# Patient Record
Sex: Female | Born: 1940 | Race: Black or African American | Hispanic: No | Smoking: Never smoker
Health system: Southern US, Community
[De-identification: ages and names within clinical notes are randomized; demographics above are authoritative.]

## PROBLEM LIST (undated history)

## (undated) DIAGNOSIS — Z5189 Encounter for other specified aftercare: Secondary | ICD-10-CM

## (undated) DIAGNOSIS — F419 Anxiety disorder, unspecified: Secondary | ICD-10-CM

## (undated) DIAGNOSIS — Z8719 Personal history of other diseases of the digestive system: Secondary | ICD-10-CM

## (undated) DIAGNOSIS — R319 Hematuria, unspecified: Secondary | ICD-10-CM

## (undated) DIAGNOSIS — E785 Hyperlipidemia, unspecified: Secondary | ICD-10-CM

## (undated) DIAGNOSIS — F32A Depression, unspecified: Secondary | ICD-10-CM

## (undated) DIAGNOSIS — R1310 Dysphagia, unspecified: Secondary | ICD-10-CM

## (undated) DIAGNOSIS — K5790 Diverticulosis of intestine, part unspecified, without perforation or abscess without bleeding: Secondary | ICD-10-CM

## (undated) DIAGNOSIS — IMO0001 Reserved for inherently not codable concepts without codable children: Secondary | ICD-10-CM

## (undated) DIAGNOSIS — I10 Essential (primary) hypertension: Secondary | ICD-10-CM

## (undated) DIAGNOSIS — H269 Unspecified cataract: Secondary | ICD-10-CM

## (undated) DIAGNOSIS — N289 Disorder of kidney and ureter, unspecified: Secondary | ICD-10-CM

## (undated) DIAGNOSIS — M1990 Unspecified osteoarthritis, unspecified site: Secondary | ICD-10-CM

## (undated) DIAGNOSIS — J189 Pneumonia, unspecified organism: Secondary | ICD-10-CM

## (undated) DIAGNOSIS — R51 Headache: Secondary | ICD-10-CM

## (undated) DIAGNOSIS — E789 Disorder of lipoprotein metabolism, unspecified: Secondary | ICD-10-CM

## (undated) DIAGNOSIS — E039 Hypothyroidism, unspecified: Secondary | ICD-10-CM

## (undated) DIAGNOSIS — F329 Major depressive disorder, single episode, unspecified: Secondary | ICD-10-CM

## (undated) DIAGNOSIS — D649 Anemia, unspecified: Secondary | ICD-10-CM

## (undated) DIAGNOSIS — K219 Gastro-esophageal reflux disease without esophagitis: Secondary | ICD-10-CM

## (undated) DIAGNOSIS — I209 Angina pectoris, unspecified: Secondary | ICD-10-CM

## (undated) DIAGNOSIS — T7840XA Allergy, unspecified, initial encounter: Secondary | ICD-10-CM

## (undated) HISTORY — DX: Unspecified cataract: H26.9

## (undated) HISTORY — PX: ABDOMINAL HYSTERECTOMY: SHX81

## (undated) HISTORY — PX: THYROIDECTOMY: SHX17

## (undated) HISTORY — PX: APPENDECTOMY: SHX54

## (undated) HISTORY — PX: KIDNEY SURGERY: SHX687

## (undated) HISTORY — DX: Diverticulosis of intestine, part unspecified, without perforation or abscess without bleeding: K57.90

## (undated) HISTORY — PX: CHOLECYSTECTOMY: SHX55

## (undated) HISTORY — DX: Dysphagia, unspecified: R13.10

## (undated) HISTORY — DX: Hyperlipidemia, unspecified: E78.5

## (undated) HISTORY — DX: Allergy, unspecified, initial encounter: T78.40XA

---

## 1996-09-16 ENCOUNTER — Encounter (INDEPENDENT_AMBULATORY_CARE_PROVIDER_SITE_OTHER): Payer: Self-pay | Admitting: *Deleted

## 1997-05-07 ENCOUNTER — Encounter: Payer: Self-pay | Admitting: Internal Medicine

## 1997-05-25 ENCOUNTER — Encounter: Payer: Self-pay | Admitting: Internal Medicine

## 1999-06-26 ENCOUNTER — Other Ambulatory Visit: Admission: RE | Admit: 1999-06-26 | Discharge: 1999-06-26 | Payer: Self-pay | Admitting: Family Medicine

## 1999-07-19 ENCOUNTER — Encounter: Payer: Self-pay | Admitting: Family Medicine

## 1999-07-19 ENCOUNTER — Encounter: Admission: RE | Admit: 1999-07-19 | Discharge: 1999-07-19 | Payer: Self-pay | Admitting: Family Medicine

## 2000-04-04 ENCOUNTER — Encounter: Payer: Self-pay | Admitting: Family Medicine

## 2000-04-04 ENCOUNTER — Encounter: Admission: RE | Admit: 2000-04-04 | Discharge: 2000-04-04 | Payer: Self-pay | Admitting: Family Medicine

## 2002-10-09 ENCOUNTER — Encounter: Admission: RE | Admit: 2002-10-09 | Discharge: 2002-10-09 | Payer: Self-pay

## 2004-11-27 ENCOUNTER — Encounter (INDEPENDENT_AMBULATORY_CARE_PROVIDER_SITE_OTHER): Payer: Self-pay | Admitting: *Deleted

## 2004-11-27 ENCOUNTER — Ambulatory Visit (HOSPITAL_COMMUNITY): Admission: RE | Admit: 2004-11-27 | Discharge: 2004-11-27 | Payer: Self-pay | Admitting: Urology

## 2005-01-19 ENCOUNTER — Ambulatory Visit (HOSPITAL_COMMUNITY): Admission: RE | Admit: 2005-01-19 | Discharge: 2005-01-19 | Payer: Self-pay | Admitting: Urology

## 2005-02-26 ENCOUNTER — Encounter (INDEPENDENT_AMBULATORY_CARE_PROVIDER_SITE_OTHER): Payer: Self-pay | Admitting: *Deleted

## 2005-02-26 ENCOUNTER — Inpatient Hospital Stay (HOSPITAL_COMMUNITY): Admission: RE | Admit: 2005-02-26 | Discharge: 2005-03-02 | Payer: Self-pay | Admitting: Urology

## 2005-02-26 ENCOUNTER — Encounter (INDEPENDENT_AMBULATORY_CARE_PROVIDER_SITE_OTHER): Payer: Self-pay | Admitting: Specialist

## 2005-03-01 ENCOUNTER — Encounter (INDEPENDENT_AMBULATORY_CARE_PROVIDER_SITE_OTHER): Payer: Self-pay | Admitting: *Deleted

## 2005-08-24 ENCOUNTER — Ambulatory Visit (HOSPITAL_COMMUNITY): Admission: RE | Admit: 2005-08-24 | Discharge: 2005-08-24 | Payer: Self-pay | Admitting: Urology

## 2005-08-24 ENCOUNTER — Encounter (INDEPENDENT_AMBULATORY_CARE_PROVIDER_SITE_OTHER): Payer: Self-pay | Admitting: *Deleted

## 2006-08-02 ENCOUNTER — Encounter: Admission: RE | Admit: 2006-08-02 | Discharge: 2006-08-02 | Payer: Self-pay | Admitting: Family Medicine

## 2006-11-20 ENCOUNTER — Encounter: Admission: RE | Admit: 2006-11-20 | Discharge: 2006-11-20 | Payer: Self-pay | Admitting: Family Medicine

## 2006-12-06 ENCOUNTER — Encounter: Admission: RE | Admit: 2006-12-06 | Discharge: 2006-12-06 | Payer: Self-pay | Admitting: Neurosurgery

## 2009-02-15 ENCOUNTER — Encounter (INDEPENDENT_AMBULATORY_CARE_PROVIDER_SITE_OTHER): Payer: Self-pay | Admitting: *Deleted

## 2009-03-16 ENCOUNTER — Ambulatory Visit: Payer: Self-pay | Admitting: Internal Medicine

## 2009-03-16 DIAGNOSIS — R1013 Epigastric pain: Secondary | ICD-10-CM

## 2009-03-16 DIAGNOSIS — R1310 Dysphagia, unspecified: Secondary | ICD-10-CM | POA: Insufficient documentation

## 2009-03-16 DIAGNOSIS — K5730 Diverticulosis of large intestine without perforation or abscess without bleeding: Secondary | ICD-10-CM | POA: Insufficient documentation

## 2009-03-16 DIAGNOSIS — K219 Gastro-esophageal reflux disease without esophagitis: Secondary | ICD-10-CM | POA: Insufficient documentation

## 2009-03-16 DIAGNOSIS — E119 Type 2 diabetes mellitus without complications: Secondary | ICD-10-CM

## 2009-04-07 ENCOUNTER — Ambulatory Visit: Payer: Self-pay | Admitting: Internal Medicine

## 2009-04-11 ENCOUNTER — Encounter (INDEPENDENT_AMBULATORY_CARE_PROVIDER_SITE_OTHER): Payer: Self-pay | Admitting: *Deleted

## 2009-04-11 ENCOUNTER — Telehealth (INDEPENDENT_AMBULATORY_CARE_PROVIDER_SITE_OTHER): Payer: Self-pay | Admitting: *Deleted

## 2009-04-11 ENCOUNTER — Encounter: Payer: Self-pay | Admitting: Internal Medicine

## 2009-05-13 ENCOUNTER — Ambulatory Visit: Payer: Self-pay | Admitting: Internal Medicine

## 2009-05-13 ENCOUNTER — Ambulatory Visit (HOSPITAL_COMMUNITY): Admission: RE | Admit: 2009-05-13 | Discharge: 2009-05-13 | Payer: Self-pay | Admitting: Internal Medicine

## 2010-01-29 ENCOUNTER — Encounter: Payer: Self-pay | Admitting: Family Medicine

## 2010-02-07 NOTE — Assessment & Plan Note (Signed)
Summary: DYSPHASIA / ABDOMINAL PAIN /    History of Present Illness Visit Type: Initial Consult Primary GI MD: Yancey Flemings MD Primary Provider: Burnell Blanks, MD Requesting Provider: Dr. Nathanial Rancher Chief Complaint: chest pain non-cardiac & dysphagia x  4 months History of Present Illness:   [name] with multiple medical problems including hypothyroidism, hypertension, diabetes mellitus, dyslipidemia, depression, GERD complicated by peptic stricture, osteoarthritis, renal cell carcinoma, and glaucoma. She said today regarding reflux symptoms, epigastric pain, and dysphagia. Also, screening colonoscopy. She is accompanied by her granddaughter. The patient reports been under a great deal of stress since 2009/09/18when her husband passed away. Since that time she describes problems with burning chest discomfort and epigastric pain. Also intermittent dysphagia, mostly to solids, but occasionally for liquids. Food can exacerbate symptoms. However, she may have some symptoms unrelated to meals. She has had decreased appetite and 20 pound weight loss over the past year. Review of outside records from Dr. Miguel Rota office shows that the patient was treated with ciprofloxacin and metronidazole for left lower quadrant pain about one month ago. The pain has resolved. Patient also tells me that she was on Nexium for about 4 weeks. This helped some of her upper GI symptoms, though not all of her symptoms. Lower GI complaints include occasional constipation for which she takes pomegranate juice (this helps). No bleeding. She describes her epigastric discomfort as burning. No radiation. No additional exacerbating or relieving factors. The duration of symptoms varies as does the time of day that symptoms can occur. Review of old records also finds that the patient has undergone prior upper endoscopy with esophageal dilation in 1997, 1998, 1999. This for a benign peptic stricture. Also,  1999 she underwent colonoscopy to evaluate Hemoccult-positive stool. The report states that she had mild diverticulosis only. Her multiple significant medical problems are stable. She takes oral agents for her diabetes.   GI Review of Systems    Reports abdominal pain, acid reflux, chest pain, dysphagia with liquids, dysphagia with solids, loss of appetite, and  weight loss.     Location of  Abdominal pain: epigastric area. Weight loss of 20 pounds pounds over one year.   Denies belching, bloating, heartburn, nausea, vomiting, vomiting blood, and  weight gain.      Reports diverticulosis.     Denies anal fissure, black tarry stools, change in bowel habit, constipation, diarrhea, fecal incontinence, heme positive stool, hemorrhoids, irritable bowel syndrome, jaundice, light color stool, liver problems, rectal bleeding, and  rectal pain. Preventive Screening-Counseling & Management  Alcohol-Tobacco     Smoking Status: never      Drug Use:  no.      Current Medications (verified): 1)  Epipen 0.3 Mg/0.54ml Devi (Epinephrine) .... Use As Directed As Needed 2)  Glipizide 5 Mg Tabs (Glipizide) .Marland Kitchen.. 1 Tablet By Mouth Once Daily 3)  Synthroid 88 Mcg Tabs (Levothyroxine Sodium) .Marland Kitchen.. 1 Tablet By Mouth Once Daily 4)  Toprol Xl 25 Mg Xr24h-Tab (Metoprolol Succinate) .Marland Kitchen.. 1 Tablet By Mouth Once Daily 5)  Metformin Hcl 500 Mg Tabs (Metformin Hcl) .... Take 2 Tablets By Mouth Once Daily 6)  Furosemide 20 Mg Tabs (Furosemide) .... Take 1 By Mouth Once Daily As Needed Edema 7)  Crestor 5 Mg Tabs (Rosuvastatin Calcium) .... Take 1 By Mouth Once Daily 8)  Benicar Hct 20-12.5 Mg Tabs (Olmesartan Medoxomil-Hctz) .... 2 Tablet By Mouth Once Daily 9)  Lexapro 10 Mg Tabs (Escitalopram Oxalate) .Marland Kitchen.. 1 Tablet By Mouth  Once Daily 10)  Enablex 7.5 Mg Xr24h-Tab (Darifenacin Hydrobromide) .Marland Kitchen.. 1-2  Tablets By Mouth Once Daily 11)  Nexium 40 Mg Cpdr (Esomeprazole Magnesium) .Marland Kitchen.. 1 Capsule By Mouth Once Daily 12)   Klor-Con M20 20 Meq Cr-Tabs (Potassium Chloride Crys Cr) .... Take 40 Mg By Mouth Once Daily As Needed Along With Furosemide 13)  Multivitamins  Tabs (Multiple Vitamin) .Marland Kitchen.. 1 Tablet By Mouth Once Daily  Allergies: 1)  ! * Insect Stings 2)  ! Codeine 3)  ! Aspirin 4)  ! Sulfa 5)  ! Novocain  Past History:  Past Medical History: Renal Cell Carcinoma Thrombocytopenia Depression Hypertension Hypothyroidism Osteoarthritis Esophageal Stricture Gallstones Stress Incontinence Diabetes Diverticulosis GERD Glaucoma  Past Surgical History: nephrectomy hysterectomy Appendectomy Cholecystectomy Thyroid Surgery  Family History: No FH of Colon Cancer: Family History of Colon Polyps:Mother Family History of Heart Disease:   Social History: Occupation:  Patient has never smoked.  Alcohol Use - no Daily Caffeine Use- once weekly Illicit Drug Use - no Smoking Status:  never Drug Use:  no  Review of Systems       The patient complains of blood in urine, cough, depression-new, night sweats, and urine leakage.  The patient denies allergy/sinus, anemia, anxiety-new, arthritis/joint pain, back pain, breast changes/lumps, change in vision, confusion, coughing up blood, fainting, fatigue, fever, headaches-new, hearing problems, heart murmur, heart rhythm changes, itching, menstrual pain, muscle pains/cramps, nosebleeds, pregnancy symptoms, shortness of breath, skin rash, sleeping problems, sore throat, swelling of feet/legs, swollen lymph glands, thirst - excessive , urination - excessive , urination changes/pain, vision changes, and voice change.    Vital Signs:  Patient profile:   70 year old female Height:      65 inches Weight:      189 pounds BMI:     31.56 Pulse rate:   68 / minute Pulse rhythm:   regular BP sitting:   130 / 72  (left arm) Cuff size:   regular  Vitals Entered By: June McMurray CMA Duncan Dull) (March 16, 2009 9:55 AM)  Physical Exam  General:  Well  developed, well nourished, no acute distress. Head:  Normocephalic and atraumatic. Eyes:  PERRLA, no icterus. Nose:  No deformity, discharge,  or lesions. Mouth:  No deformity or lesions. Neck:  Supple; no masses or thyromegaly.prior thyroidectomy scar well healed Lungs:  Clear throughout to auscultation. Heart:  Regular rate and rhythm; no murmurs, rubs,  or bruits. Abdomen:  Soft,obese, nontender and nondistended. No masses, hepatosplenomegaly or hernias noted. Normal bowel sounds. Multiple prior surgical incisions well-healed. Rectal:  deferred until colonoscopy Msk:  Symmetrical with no gross deformities. Normal posture. Pulses:  Normal pulses noted. Extremities:  No clubbing, cyanosis, edema or deformities noted. Neurologic:  Alert and  oriented x4;  grossly normal neurologically. Skin:  Intact without significant lesions or rashes. Psych:  Alert and cooperative. Normal mood and affect.   Impression & Recommendations:  Problem # 1:  DYSPHAGIA UNSPECIFIED (ICD-787.20) Intermittent dysphagia to solids greater than liquids. Known history of peptic stricture. Suspect recurrence of the same.  Plan: #1. Upper endoscopy with esophageal dilation. The nature of the procedure as well as the risks, benefits, and alternatives have been reviewed. She understood and agreed to proceed. The patient is HIGH RISK given her multiple medical problems. #2. Need to adjust diabetic medications preprocedure in order to avoid unwanted hypoglycemia. The patient has been instructed to hold her oral diabetic medications the morning of the procedure. We will monitor her blood sugar immediately  before and after her procedure.  Problem # 2:  GERD (ICD-530.81) chronic GERD. Some symptoms improved on PPI. Now off PPI therapy. Nexium too expensive.  Plan: #1. Reflux precautions #2. Prescribe omeprazole 40 mg daily. As well, a few samples of Nexium given until she can pick up her prescription  Problem # 3:   ABDOMINAL PAIN, EPIGASTRIC (ICD-789.06) Epigastric pain probably related to reflux.  Plan: #1. Daily PPI #2. At the time of endoscopy, assess her other possible causes for epigastric pain #3. If pain persists despite adequate treatment of GERD, consider CT scan, particularly given weight loss, to evaluate for other causes  Problem # 4:  SPECIAL SCREENING FOR MALIGNANT NEOPLASMS COLON (ICD-V76.51) last colonoscopy in 1999. Due for repeat screening. Minimal lower GI symptoms except for mild constipation managed with pomegranate juice and recent left lower quadrant discomfort, possibly diverticulitis.  Plan: #1. Colonoscopy. The nature of the procedure as well as the risks, benefits, and alternatives were reviewed. She understood and agreed to proceed #2. Movi prep prescribed. The patient instructed on its use #3. Perform the procedure on the same day as endoscopy for patient convenience and to minimize risk #4. The just diabetic medications as stated above  Problem # 5:  DM (ICD-250.00) let us diabetic medications in order to avoid unwanted hypoglycemia. This has been outlined in detail above  Problem # 6:  DIVERTICULOSIS-COLON (ICD-562.10) on prior colonoscopy. Question recent diverticulitis. Assess further at colonoscopy  Other Orders: Colon/Endo (Colon/Endo)  Patient Instructions: 1)  Colon/Endo [facility] 04/07/09 3:30 pm 2)  Movi prep instructions given to patient and Rx. sent to patient's pharmacy  3)  Colonoscopy and Flexible Sigmoidoscopy brochure given.  4)  Upper Endoscopy brochure given.  5)  Rx. for Omeprazole 40 mg 1 by mouth once daily #30 x 11 RFs. 6)  Patient is to hold oral diabetic meds morning of procedure. 7)  cc:  Burnell Blanks, MD 8)  The medication list was reviewed and reconciled.  All changed / newly prescribed medications were explained.  A complete medication list was provided to the patient / caregiver. 9)  Pt. instructions printed and given to patient. Milford Cage NCMA  March 16, 2009 10:42 AM Prescriptions: OMEPRAZOLE 40 MG CPDR (OMEPRAZOLE) 1 CAPSULE by mouth QD  #30 x 11   Entered by:   Milford Cage NCMA   Authorized by:   Hilarie Fredrickson MD   Signed by:   Milford Cage NCMA on 03/16/2009   Method used:   Print then Give to Patient   RxID:   [id number] MOVIPREP 100 GM  SOLR (PEG-KCL-NACL-NASULF-NA ASC-C) As per prep instructions.  #1 x 0   Entered by:   Milford Cage NCMA   Authorized by:   Hilarie Fredrickson MD   Signed by:   Milford Cage NCMA on 03/16/2009   Method used:   Print then Give to Patient   RxID:   [id number]

## 2010-02-07 NOTE — Procedures (Signed)
Summary: Endoscopy with Savary Dilatation/[facility]  Endoscopy with Savary Dilatation/[facility]   Imported By: Sherian Rein 03/17/2009 13:35:11  _____________________________________________________________________  External Attachment:    Type:   Image     Comment:   External Document

## 2010-02-07 NOTE — Procedures (Signed)
Summary: Upper Endoscopy & Esophageal Dilation/[facility]  Upper Endoscopy & Esophageal Dilation/[facility]   Imported By: Sherian Rein 03/17/2009 13:37:25  _____________________________________________________________________  External Attachment:    Type:   Image     Comment:   External Document

## 2010-02-07 NOTE — Letter (Signed)
Summary: EGD Instructions  [facility]  [address] [place], [place] [address]   Phone: [phone]  Fax: [phone]       [name]    04-04-1940    MRN: [id number]       Procedure Day /Date:Friday-May 6/11     Arrival Time: 7:30 am     Procedure Time:8:30 am     Location of Procedure:                    _  _ [facility] (4th Floor) _ X _ [facility] ( Outpatient Registration) _  _ [facility] (Short Stay on [address]   PREPARATION FOR ENDOSCOPY   On Friday5/6/11  THE DAY OF THE PROCEDURE:  1.   No solid foods, milk or milk products are allowed after midnight the night before your procedure.  2.   Do not drink anything colored red or purple.  Avoid juices with pulp.  No orange juice.  3.  You may drink clear liquids until_  _, which is 2 hours before your procedure.                                                                                                CLEAR LIQUIDS INCLUDE: Water Jello Ice Popsicles Tea (sugar ok, no milk/cream) Powdered fruit flavored drinks Coffee (sugar ok, no milk/cream) Gatorade Juice: apple, white grape, white cranberry  Lemonade Clear bullion, consomm, broth Carbonated beverages (any kind) Strained chicken noodle soup Hard Candy   MEDICATION INSTRUCTIONS-.Unless otherwise instructed, you should take regular prescription medications with a small sip of water as early as possible the morning of your procedure.  Diabetic patients -   Additional medication instructions:-Do not take oral diabetic pills the am of the procedure             OTHER INSTRUCTIONS  You will need a responsible adult at least 68 years of age to accompany you and drive you home.   This person must remain in the waiting room during your procedure.  Wear loose fitting clothing that is easily removed.  Leave jewelry and other valuables at home.  However, you may wish to bring a book to read or  an iPod/MP3 player to listen to music as you wait for your procedure to start.  Remove all body piercing jewelry and leave at home.  Total time from sign-in until discharge is approximately 2-3 hours.  You should go home directly after your procedure and rest.  You can resume normal activities the day after your procedure.  The day of your procedure you should not:   Drive   Make legal decisions   Operate machinery   Drink alcohol   Return to work  You will receive specific instructions about eating, activities and medications before you leave.    The above instructions have been reviewed and explained to me by   Elnita Maxwell via phone and mailed  04/12/09   I

## 2010-02-07 NOTE — Procedures (Signed)
Summary: Colonoscopy  Patient: [name] Note: All result statuses are Final unless otherwise noted.  Tests: (1) Colonoscopy (COL)   COL Colonoscopy           DONE     [facility]     [address]     [place], [place]  [address]           COLONOSCOPY PROCEDURE REPORT           PATIENT:  [name], [name]  MR#:  [id number]     BIRTHDATE:  07/24/1940, 68 yrs. old  GENDER:  female     ENDOSCOPIST:  Wilhemina Bonito. Eda Keys, MD     REF. BY:  Burnell Blanks, M.D.     PROCEDURE DATE:  04/07/2009     PROCEDURE:  Colonoscopy with snare polypectomy X 2           INDICATIONS:  Routine Risk Screening     MEDICATIONS:   Fentanyl 100 mcg IV, Versed 10 mg IV, Benadryl 25mg      IV           DESCRIPTION OF PROCEDURE:   After the risks benefits and     alternatives of the procedure were thoroughly explained, informed     consent was obtained.  Digital rectal exam was performed and     revealed no abnormalities.   The LB CF-H180AL [id number] endoscope     was introduced through the anus and advanced to the cecum, which     was identified by both the appendix and ileocecal valve, without     limitations.TIME TO CECUM = 8:12MIN The quality of the prep was     excellent, using MoviPrep.  The instrument was then slowly     withdrawn (TIME = 10:52 MIN) as the colon was fully examined.     <<PROCEDUREIMAGES>>           FINDINGS:  Difficult to advance scope secondary to adhesions /     diverticulosis. Two polyps were found. 4mm descending and 9mm     rectal. Polyps were snared without cautery. Retrieval was     successful.  Moderate diverticulosis was found throughout the     colon with sigmoid stenosis.   Retroflexed views in the rectum     revealed internal hemorrhoids.    The scope was then withdrawn     from the patient and the procedure completed.           COMPLICATIONS:  None     ENDOSCOPIC IMPRESSION:     1) Two polyps - removed     2) Moderate diverticulosis throughout the colon w/  sigmoid     stenosis     3) Internal hemorrhoids           RECOMMENDATIONS:     1) Follow up colonoscopy in 5 years     2) EGD today           ______________________________     Wilhemina Bonito. Eda Keys, MD           CC:  Burnell Blanks, MD; The Patient           n.     eSIGNED:   Wilhemina Bonito. Eda Keys at 04/07/2009 03:37 PM           Roosvelt Harps, [id number]  Note: An exclamation mark (!) indicates a result that was not dispersed into the flowsheet. Document Creation Date: 04/07/2009 3:37 PM _______________________________________________________________________  (1) Order  result status: Final Collection or observation date-time: 04/07/2009 15:28 Requested date-time:  Receipt date-time:  Reported date-time:  Referring Physician:   Ordering Physician: Fransico Setters [phone]) Specimen Source:  Source: Launa Grill Order Number: [id number] Lab site:   Appended Document: Colonoscopy recall 5 yrs/04-2014     Procedures Next Due Date:    Colonoscopy: 04/2014

## 2010-02-07 NOTE — Procedures (Signed)
Summary: Upper Endoscopy  Patient: [name] Note: All result statuses are Final unless otherwise noted.  Tests: (1) Upper Endoscopy (EGD)   EGD Upper Endoscopy       DONE     [facility]     [address]     [place], [place]  [address]           ENDOSCOPY PROCEDURE REPORT           PATIENT:  [name], [name]  MR#:  [id number]     BIRTHDATE:  Apr 18, 1940, 68 yrs. old  GENDER:  female           ENDOSCOPIST:  Wilhemina Bonito. Eda Keys, MD     Referred by:  Burnell Blanks, M.D.           PROCEDURE DATE:  04/07/2009     PROCEDURE:  EGD, diagnostic     ASA CLASS:  Class II     INDICATIONS:  dysphagia, abdominal pain           MEDICATIONS:   There was residual sedation effect present from     prior procedure., Versed 4 mg IV     TOPICAL ANESTHETIC:  Exactacain Spray           DESCRIPTION OF PROCEDURE:   After the risks benefits and     alternatives of the procedure were thoroughly explained, informed     consent was obtained.  The LB GIF-H180 [id number] endoscope was     introduced through the mouth and advanced to the second portion of     the duodenum, without limitations.  The instrument was slowly     withdrawn as the mucosa was fully examined.     <<PROCEDUREIMAGES>>           Several shelf-like  webs were present in the distal third of the     esophagus.  A stricture was found in the distal esophagus as     wellas reflux esophagitis.  Otherwise the examination was normal     to D2.    Retroflexed views revealed a small hiatal hernia.    The     scope was then withdrawn from the patient and the procedure     completed. No dilation due to anatomy           COMPLICATIONS:  None           ENDOSCOPIC IMPRESSION:     1) Webs of the esophagus     2) Stricture in the distal esophagus     3) Esophagitis     4) GERD           RECOMMENDATIONS:     1) continue PPI - Omeprazole daily     2) EGD/DILATION (savary) at HOSPITAL in 4-6 weeks        ______________________________     Wilhemina Bonito. Eda Keys, MD           CC:  Burnell Blanks, MD, The Patient           n.     eSIGNEDWilhemina Bonito. Eda Keys at 04/07/2009 03:50 PM           Roosvelt Harps, [id number]  Note: An exclamation mark (!) indicates a result that was not dispersed into the flowsheet. Document Creation Date: 04/07/2009 3:51 PM _______________________________________________________________________  (1) Order result status: Final Collection or observation date-time: 04/07/2009 15:42 Requested date-time:  Receipt date-time:  Reported date-time:  Referring Physician:  Ordering Physician: Fransico Setters [phone]) Specimen Source:  Source: Launa Grill Order Number: [id number] Lab site:

## 2010-02-07 NOTE — Letter (Signed)
Summary: Patient Notice- Polyp Results  [facility]  [address] [place], [place] [address]   Phone: [phone]  Fax: [phone]        April 11, 2009 MRN: [id number]    ALLEIGH MOLLICA [address] [place], [place]  [address]    Dear Ms. Pfost,  I am pleased to inform you that the colon polyp(s) removed during your recent colonoscopy was (were) found to be benign (no cancer detected) upon pathologic examination.  I recommend you have a repeat colonoscopy examination in 5 years to look for recurrent polyps, as having colon polyps increases your risk for having recurrent polyps or even colon cancer in the future.  Should you develop new or worsening symptoms of abdominal pain, bowel habit changes or bleeding from the rectum or bowels, please schedule an evaluation with either your primary care physician or with me.  Additional information/recommendations:  __ No further action with gastroenterology is needed at this time. Please      follow-up with your primary care physician for your other healthcare      needs.  Please call us if you are having persistent problems or have questions about your condition that have not been fully answered at this time.  Sincerely,  Hilarie Fredrickson MD  This letter has been electronically signed by your physician.  Appended Document: Patient Notice- Polyp Results letter mailed 4.7.11

## 2010-02-07 NOTE — Procedures (Signed)
Summary: Upper Endoscopy  Patient: Collyns Mcquigg Note: All result statuses are Final unless otherwise noted.  Tests: (1) Upper Endoscopy (EGD)   EGD Upper Endoscopy       DONE     [facility]     [address] [place], [place]  [address]           ENDOSCOPY PROCEDURE REPORT           PATIENT:  Marie, Borowski  MR#:  [id number]     BIRTHDATE:  October 29, 1940, 68 yrs. old  GENDER:  female           ENDOSCOPIST:  Wilhemina Bonito. Eda Keys, MD     Referred by:  .Direct           PROCEDURE DATE:  05/13/2009     PROCEDURE:  EGD with dilatation over guidewire - 18mm Savary     FLUOROSCOPY ASSITANCE FOR     ESOPHAGEAL STRICTURE DILATION     ASA CLASS:  Class II     INDICATIONS:  dilation of esophageal stricture, dysphagia           MEDICATIONS:   Fentanyl 100 mcg, Versed 7.5 mg IV     TOPICAL ANESTHETIC:  Cetacaine Spray           DESCRIPTION OF PROCEDURE:   After the risks benefits and     alternatives of the procedure were thoroughly explained, informed     consent was obtained.  The Pentax Gastroscope [id number] endoscope     was introduced through the mouth and advanced to the second     portion of the duodenum, without limitations.  The instrument was     slowly withdrawn as the mucosa was fully examined.     <<PROCEDUREIMAGES>>           A 15mm stricture was found in the distal esophagus.  Otherwise the     examination was unremarkable to D2.    Retroflexed views revealed     a small hiatal hernia.           THERAPY: SAVARY GUIDEWIRE PLACED INTO GASTRIC ANTRUM UNDER     ENDOSCOPIC AND FLUOROSCOPIC CONTROL. SCOPE REMOVED. DILATOR     PASSED OVER WIRE. FLUO USED THROUGHOUT. NO RESISTANCE OR HEME.     TOLERATED WELL           COMPLICATIONS:  None           ENDOSCOPIC IMPRESSION:     1) Stricture in the distal esophagus - S/P DILATION     2) Otherwise normal examination     3) A hiatal hernia     4) Gerd     RECOMMENDATIONS:     1) Clear liquids until 11:30  AM, then soft foods rest of day.     Resume prior diet tomorrow.     2) continue current medications     3) follow-up: GI lab PRN           ______________________________     Wilhemina Bonito. Eda Keys, MD           CC:  Burnell Blanks, MD, The Patient           n.     eSIGNEDWilhemina Bonito. Eda Keys at 05/13/2009 09:29 AM           Roosvelt Harps, [id number]  Note: An exclamation mark (!) indicates a result that was not dispersed  into the flowsheet. Document Creation Date: 05/13/2009 9:30 AM _______________________________________________________________________  (1) Order result status: Final Collection or observation date-time: 05/13/2009 09:22 Requested date-time:  Receipt date-time:  Reported date-time:  Referring Physician:   Ordering Physician: Fransico Setters [phone]) Specimen Source:  Source: Launa Grill Order Number: [id number] Lab site:

## 2010-02-07 NOTE — Progress Notes (Signed)
  Phone Note Outgoing Call   Summary of Call: message left for pt. to call back to schedule dil at [facility]. Initial call taken by: Teryl Lucy RN,  April 11, 2009 9:17 AM  Follow-up for Phone Call        pt. scheduled for sav/dil with fluro at [facility] on 05/13/09 at 8:30 am.Given directions over the phone to do clear liquids from midnight  until 4:30 am and then to be npo 4 hrs. prior.Told to hold diabetic meds that am  until after procedure.Instructions also mailed. Follow-up by: Teryl Lucy RN,  April 11, 2009 1:59 PM

## 2010-02-07 NOTE — Letter (Signed)
Summary: [facility] Instructions  [facility]  [address] [place], [place] [address]   Phone: [phone]  Fax: [phone]       [name]    09-05-1940    MRN: [id number]        Procedure Day /Date:THURSDAY, 04/07/09     Arrival Time:2:30 PM     Procedure Time:3:30 PM     Location of Procedure:                    X  [facility] (4th Floor)                        PREPARATION FOR COLONOSCOPY WITH MOVIPREP/ENDO   Starting 5 days prior to your procedure 04/02/09 do not eat nuts, seeds, popcorn, corn, beans, peas,  salads, or any raw vegetables.  Do not take any fiber supplements (e.g. Metamucil, Citrucel, and Benefiber).  THE DAY BEFORE YOUR PROCEDURE         DATE: 04/06/09  ONG:[id number]  1.  Drink clear liquids the entire day-NO SOLID FOOD  2.  Do not drink anything colored red or purple.  Avoid juices with pulp.  No orange juice.  3.  Drink at least 64 oz. (8 glasses) of fluid/clear liquids during the day to prevent dehydration and help the prep work efficiently.  CLEAR LIQUIDS INCLUDE: Water Jello Ice Popsicles Tea (sugar ok, no milk/cream) Powdered fruit flavored drinks Coffee (sugar ok, no milk/cream) Gatorade Juice: apple, white grape, white cranberry  Lemonade Clear bullion, consomm, broth Carbonated beverages (any kind) Strained chicken noodle soup Hard Candy                             4.  In the morning, mix first dose of MoviPrep solution:    Empty 1 Pouch A and 1 Pouch B into the disposable container    Add lukewarm drinking water to the top line of the container. Mix to dissolve    Refrigerate (mixed solution should be used within 24 hrs)  5.  Begin drinking the prep at 5:00 p.m. The MoviPrep container is divided by 4 marks.   Every 15 minutes drink the solution down to the next mark (approximately 8 oz) until the full liter is complete.   6.  Follow completed prep with 16 oz of clear liquid of your choice  (Nothing red or purple).  Continue to drink clear liquids until bedtime.  7.  Before going to bed, mix second dose of MoviPrep solution:    Empty 1 Pouch A and 1 Pouch B into the disposable container    Add lukewarm drinking water to the top line of the container. Mix to dissolve    Refrigerate  THE DAY OF YOUR PROCEDURE      DATE: 04/07/09 DAY: THURSDAY  Beginning at 10:30 a.m. (5 hours before procedure):         1. Every 15 minutes, drink the solution down to the next mark (approx 8 oz) until the full liter is complete.  2. Follow completed prep with 16 oz. of clear liquid of your choice.    3. You may drink clear liquids until 1:30 PM (2 HOURS BEFORE PROCEDURE).   MEDICATION INSTRUCTIONS  Unless otherwise instructed, you should take regular prescription medications with a small sip of water   as early as possible the morning of your procedure.  Diabetic patients -  see separate instructions.    Additional medication instructions: _         OTHER INSTRUCTIONS  You will need a responsible adult at least 68 years of age to accompany you and drive you home.   This person must remain in the waiting room during your procedure.  Wear loose fitting clothing that is easily removed.  Leave jewelry and other valuables at home.  However, you may wish to bring a book to read or  an iPod/MP3 player to listen to music as you wait for your procedure to start.  Remove all body piercing jewelry and leave at home.  Total time from sign-in until discharge is approximately 2-3 hours.  You should go home directly after your procedure and rest.  You can resume normal activities the  day after your procedure.  The day of your procedure you should not:   Drive   Make legal decisions   Operate machinery   Drink alcohol   Return to work  You will receive specific instructions about eating, activities and medications before you leave.    The above instructions have been  reviewed and explained to me by   _______________________    I fully understand and can verbalize these instructions _____________________________ Date _________

## 2010-02-07 NOTE — Procedures (Signed)
Summary: Colonoscopy/[facility]  Colonoscopy/[facility]   Imported By: Sherian Rein 03/17/2009 13:39:27  _____________________________________________________________________  External Attachment:    Type:   Image     Comment:   External Document

## 2010-02-07 NOTE — Letter (Signed)
Summary: Diabetic Instructions  [facility]  [address] [place], [place] [address]   Phone: [phone]  Fax: [phone]    CABRINA SHIROMA 09-03-1940 MRN: [id number]   X  ORAL DIABETIC MEDICATION INSTRUCTIONS  The day before your procedure:   Take your diabetic pill as you do normally  The day of your procedure:   Do not take your diabetic pill    We will check your blood sugar levels during the admission process and again in Recovery before discharging you home  ________________________________________________________________________  _  _   INSULIN (LONG ACTING) MEDICATION INSTRUCTIONS (Lantus, NPH, 70/30, Humulin, Novolin-N)   The day before your procedure:   Take  your regular evening dose    The day of your procedure:   Do not take your morning dose    _  _   INSULIN (SHORT ACTING) MEDICATION INSTRUCTIONS (Regular, Humulog, Novolog)   The day before your procedure:   Do not take your evening dose   The day of your procedure:   Do not take your morning dose   _  _   INSULIN PUMP MEDICATION INSTRUCTIONS  We will contact the physician managing your diabetic care for written dosage instructions for the day before your procedure and the day of your procedure.  Once we have received the instructions, we will contact you.

## 2010-02-07 NOTE — Letter (Signed)
Summary: New Patient letter  [facility]  [address] [place], [place] [address]   Phone: [phone]  Fax: [phone]       02/15/2009 MRN: [id number]  [name] [address] [place], [place]  [address]  Dear [name],  Welcome to the Gastroenterology Division at [facility].    You are scheduled to see Dr.  Marina Goodell on 03-16-09 at 10AM on the 3rd floor at [facility], [address]  We ask that you try to arrive at our office 15 minutes prior to your appointment time to allow for check-in.  We would like you to complete the enclosed self-administered evaluation form prior to your visit and bring it with you on the day of your appointment.  We will review it with you.  Also, please bring a complete list of all your medications or, if you prefer, bring the medication bottles and we will list them.  Please bring your insurance card so that we may make a copy of it.  If your insurance requires a referral to see a specialist, please bring your referral form from your primary care physician.  Co-payments are due at the time of your visit and may be paid by cash, check or credit card.     Your office visit will consist of a consult with your physician (includes a physical exam), any laboratory testing he/she may order, scheduling of any necessary diagnostic testing (e.g. x-ray, ultrasound, CT-scan), and scheduling of a procedure (e.g. Endoscopy, Colonoscopy) if required.  Please allow enough time on your schedule to allow for any/all of these possibilities.    If you cannot keep your appointment, please call [phone] to cancel or reschedule prior to your appointment date.  This allows [place] the opportunity to schedule an appointment for another patient in need of care.  If you do not cancel or reschedule by 5 p.m. the business day prior to your appointment date, you will be charged a $50.00 late cancellation/no-show fee.    Thank you for choosing [facility] for your medical needs.  We appreciate the opportunity to care for you.  Please visit [place] at our website  to learn more about our practice.                     Sincerely,                                                             The Gastroenterology Division

## 2010-04-02 LAB — GLUCOSE, CAPILLARY
Glucose-Capillary: 113 mg/dL — ABNORMAL HIGH (ref 70–99)
Glucose-Capillary: 66 mg/dL — ABNORMAL LOW (ref 70–99)
Glucose-Capillary: 75 mg/dL (ref 70–99)
Glucose-Capillary: 81 mg/dL (ref 70–99)
Glucose-Capillary: 85 mg/dL (ref 70–99)

## 2010-05-26 NOTE — H&P (Signed)
NAMEMARISUE, [name]              ACCOUNT NO.:  [id number]   MEDICAL RECORD NO.:  [id number]          PATIENT TYPE:  INP   LOCATION:  0002                         FACILITY[facility]  [facility]   PHYSICIAN:  Heloise Purpura, MD      DATE OF BIRTH:  July 16, 1940   DATE OF ADMISSION:  02/26/2005  DATE OF DISCHARGE:                                HISTORY & PHYSICAL   CHIEF COMPLAINT:  1.  Microscopic hematuria.  2.  Left renal mass.   HISTORY:  [name] is a 70 year old female who began having abdominal pain  in her left lower quadrant. She subsequently underwent a CT scan for this  problem and incidentally was found to have a 2.7 x 2.5 cm renal mass in the  anterior aspect of the interpolar region of the left kidney concerning for a  possible renal malignancy. She subsequently underwent an MRI to further  characterize this and it was felt to be an enhancing mass worrisome for  possible malignancy. In addition, she was noted to have microscopic  hematuria. After discussing options for management, the patient elected to  undergo a laparoscopic partial nephrectomy for her renal mass. I also  explained to her that she would need further evaluation of her lower urinary  tract to completely evaluate her for her microscopic hematuria. We therefore  discussed performing cystoscopy and bilateral retrograde pyelography at the  time of her partial nephrectomy. She did undergo a metastatic evaluation as  well which was negative for metastatic disease.   PAST MEDICAL HISTORY:  1.  Diabetes mellitus.  2.  Hypertension.  3.  Hypothyroidism.  4.  Gastroesophageal reflux disease.  5.  Hypercholesterolemia.  6.  Depression.   PAST SURGICAL HISTORY:  1.  Thyroidectomy.  2.  Open cholecystectomy.  3.  Open appendectomy.  4.  Transabdominal hysterectomy for benign disease.  5.  Esophageal dilation.   MEDICATIONS:  1.  Lexapro.  2.  Enalapril.  3.  Glucophage.  4.  Glucotrol.  5.  Synthroid.  6.   Toprol-XL.  7.  Nexium.  8.  Crestor.  9.  Naproxen.   ALLERGIES:  1.  SULFA.  2.  IV CONTRAST DYE.  3.  CODEINE causing agitation.   FAMILY HISTORY:  There is no history of renal cell carcinoma or GU  malignancy in the family. There is no history of kidney failure.   SOCIAL HISTORY:  The patient is married. She is currently retired. She  denies tobacco or alcohol use.   PHYSICAL EXAMINATION:  CONSTITUTIONAL:  The patient is a well-nourished and  well-developed age-appropriate female.  LYMPH NODE SURVEY:  Negative.  CARDIOVASCULAR:  Regular rate and rhythm without obvious murmurs.  LUNGS:  Clear bilaterally.  ABDOMEN:  Soft, nontender, nondistended, without abdominal masses or bruits.  BACK:  No CVA tenderness.  EXTREMITIES:  No edema.  NEUROLOGIC:  Grossly intact.   IMPRESSION:  1.  Left renal mass worrisome for renal malignancy.  2.  Microscopic hematuria.   PLAN:  The patient will undergo cystoscopy, bilateral retrograde  pyelography, and a left laparoscopic partial nephrectomy, and then  be  admitted to the hospital for routine postoperative care.           ______________________________  Heloise Purpura, MD  Electronically Signed     LB/MEDQ  D:  02/26/2005  T:  02/26/2005  Job:  [id number]

## 2010-05-26 NOTE — Op Note (Signed)
[name], [name]              ACCOUNT NO.:  [id number]   MEDICAL RECORD NO.:  [id number]          PATIENT TYPE:  INP   LOCATION:  1420                         FACILITY:  [facility]   PHYSICIAN:  Heloise Purpura, MD      DATE OF BIRTH:  June 28, 1940   DATE OF PROCEDURE:  02/26/2005  DATE OF DISCHARGE:                                 OPERATIVE REPORT   PREOPERATIVE DIAGNOSIS:  1.  Microscopic hematuria.  2.  Left renal mass.   POSTOPERATIVE DIAGNOSES:  1.  Microscopic hematuria.  2.  Left renal mass.   PROCEDURE:  1.  Cystoscopy.  2.  Bilateral retrograde pyelography.  3.  Left laparoscopic partial nephrectomy.   SURGEON:  Dr. Heloise Purpura   ASSISTANT:  Dr. Marcine Matar   ANESTHESIA:  General.   COMPLICATIONS:  None.   ESTIMATED BLOOD LOSS:  500 mL.   INTRAVENOUS FLUIDS:  2900 mL of lactated Ringer's and 500 mL of Hespan.   INTRAOPERATIVE FINDINGS:  Frozen section of tumor base demonstrated benign  renal parenchyma.   DRAINS:  1.  A 15-French Blake.  2.  A 16-French Foley catheter.   SPECIMEN:  Left renal mass.   INDICATIONS:  [name] is a 70 year old female, who was recently found to  have microscopic hematuria as well as an incidentally detected left renal  mass.  After undergoing a negative metastatic evaluation and reviewing both  her CT scan and MRI which demonstrated an enhancing left renal mass  worrisome for malignancy, it was decided to proceed with the above  procedures.  Potential risks and benefits of these procedures were discussed  with the patient, and she consented.   DESCRIPTION OF PROCEDURE:  Radiologic findings:  Bilateral retrograde  pyelography was performed.  This demonstrated normal caliber ureters and no  evidence of filling defects within the renal pelvis or ureters bilaterally.   The patient was taken to the operating room, and a general anesthetic was  administered.  She was given preoperative antibiotics, placed in the dorsal  lithotomy position, and prepped and draped in the usual sterile fashion.  Next, a preoperative time-out was performed.  Cystourethroscopy was then  performed.  The patient was noted to have an extremely small urethral  meatus.  Therefore, female sounds were used to perform a urethral dilation  allowing the 22 French cystoscope sheath to be passed into the bladder.  This demonstrated a normal urethra.  A systematic survey of the bladder was  then performed and demonstrated no evidence of bladder tumors, stones, or  other mucosal pathology.  The ureteral orifices were noted to be in their  normal anatomic position bilaterally and effluxing clear urine.  A 5 French  ureteral catheter was then used to perform retrograde pyelography.  The  right side was first injected with hypaque contrast.  This demonstrated no  evidence of filling defects or other abnormalities.  An identical procedure  was then performed on the contralateral side and also demonstrated no  evidence of any filling defects or abnormalities in the left renal  collecting system.  The cystoscope was then removed  and replaced with a 16-  [place] Foley catheter.  The patient was then repositioned in the left  modified flank position.  Care was taken to ensure proper padding of all  potential pressure points.  She was then reprepped and draped in the usual  sterile fashion.  A site was selected just superior to the umbilicus in the  midline for placement of a camera port.  This was placed using a standard  Hasson technique.  This allowed entry into the peritoneal cavity under  direct vision.  Zero Vicryl fascial holding stitches were then placed in the  rectus fascia.  The Hasson port was then placed and secured with holding  sutures.  A pneumoperitoneum was established.  The 30-degree lens was then  used to inspect the abdomen, and there were noted to be adhesions in the  right upper quadrant as well as in the left lower quadrant,  most likely  related to the patient's prior cholecystectomy and transabdominal  hysterectomy.  However, the left upper quadrant appeared free of adhesions.  The remaining ports were then placed.  A 12 mm port was placed in the left  lower quadrant just lateral to the rectus muscle above the previously  mentioned adhesions.  A 5 mm port was placed midway between the xiphoid  process and the umbilicus just to the left of the midline.  An additional 5  mm port was placed in the far left lateral abdominal wall.  All ports were  placed under direct vision without difficulty.  With the aid of the harmonic  scalpel, the white line of Toldt was incised along the length of the  descending colon allowing the colon to be mobilized medially.  In addition,  some of the attachments between the colon and spleen were taken down to  allow the spleen to be retracted cephalad.  The kidney was dissected within  Gerota's fascia with the space between the colonic mesentery and the  anterior layer of Gerota's fascia developed.  Inferiorly, the ureter was  identified, and the space between the ureter and the psoas was able to be  bluntly developed allowing the ureter to be lifted anteriorly.  Dissection  then continued superiorly, and the renal vein was encountered.  The renal  vein was then cleared off of overlying tissue.  The renal artery was  identified just posterior to the renal vein.  On imaging, it did appear that  the patient had a single renal artery.  There was noted to be a small vein  or lymphatic that was just overlying the renal artery, and this was divided  between 5 mm Ligaclips.  The renal artery was then able to be isolated with  right angle dissection and prepared for vascular clamping.  Attention then  returned to the renal parenchyma.  The overlying perinephric fat was removed  from around the area of the tumor which was easily identifiable on the anterior aspect of the kidney in the  interpolar region.  The perinephric fat  overlying the tumor was left in place.  Once the parenchyma surrounding the  renal mass was removed, attention returned to the renal hilum. 12.5 g of IV  mannitol had been administered and the laparoscopic bulldog clamp was placed  on the renal artery approximately 10 minutes after administration, and  laparoscopic scissors were then used to excise the renal mass.  A separate  sample of renal tissue at the lateral and base of the resection site was  then  sent off for frozen section analysis.  This returned negative for  malignancy.  The 2-0 chromic figure-of-eight sutures were then placed in the  base of the resection site to control easily identifiable blood vessels.  This resulted in fairly good hemostasis.  The argon beam was then used to  coagulate the outer edges of the renal mass.  FloSeal was then injected into  the resection site and 2 Surgicel bolsters were placed into the resection  bed.  Then 2-0 chromic horizontal mattress sutures were then placed on  either side of the parenchyma to provide compression of the renal  parenchyma.  Additional FloSeal was then injected over this area, and a  piece of Surgicel was laid over the renal parenchymal repair. The bulldog  clamp was then removed from the renal artery and an additional 12.5 g of IV  mannitol was administered. Perinephric fat was then reapproximated over this  area with 2-0 chromic suture.  A 0 Vicryl suture was then placed through the  left lower quadrant 12 mm port site for later port site closure.  The renal  mass was placed into an Endopouch retrieval bag and removed via the camera  port site. A 15-French Blake drain was placed through the far lateral 5 mm  port site to be used as a perinephric drain secured to the skin with a nylon  suture.  All ports were then removed under direct vision.  The left lower  quadrant 12 mm port site was closed with the previously placed 0 Vicryl   suture.  The midline camera port site was closed with the previously placed  0 Vicryl holding sutures.  All ports were injected with 0.25% Marcaine and  reapproximated at the skin level with staples.  Sterile dressings were  applied.  All sponge and needle counts were correct x2 at the end of the  procedure. The patient appeared to tolerate the procedure well and without  complications.  Total warm ischemia time was 42 minutes and 50 seconds.  The  patient was able to be extubated and transferred to recovery in satisfactory  condition.  She appeared to tolerate the procedure well and without  complications.           ______________________________  Heloise Purpura, MD  Electronically Signed     LB/MEDQ  D:  02/26/2005  T:  02/27/2005  Job:  [id number]

## 2010-05-26 NOTE — Discharge Summary (Signed)
NAMERUTHMARY, [name]              ACCOUNT NO.:  [id number]   MEDICAL RECORD NO.:  [id number]          PATIENT TYPE:  INP   LOCATION:  1420                         FACILITY:  [facility]   PHYSICIAN:  Heloise Purpura, MD      DATE OF BIRTH:  [birth year]   DATE OF ADMISSION:  02/26/2005  DATE OF DISCHARGE:  03/02/2005                                 DISCHARGE SUMMARY   CHIEF COMPLAINT:  1.  Left renal mass.  2.  Microscopic hematuria.   HISTORY:  Ms. [name] is a 70 year old female who was incidentally found to  have an enhancing left-sided 2.7 x 2.5 cm renal mass in the anterior aspect  of the mid-region of the left kidney. She was also found to have microscopic  hematuria on clinical evaluation. After undergoing a negative metastatic  evaluation, she decided to proceed with a laparoscopic partial nephrectomy.   HOSPITAL COURSE:  Ms. [name] was brought to the operating room on February 26, 2005.  She initially underwent cystoscopy with retrograde pyelography  which demonstrated no abnormal pathology as a source for her microscopic  hematuria. She then underwent a left laparoscopic partial nephrectomy.  Postoperatively, she was able to be transferred to a regular hospital room  following recovery from anesthesia. For the first 24 hours, she was  maintained on bedrest. On postoperative day #1, she was able to begin a  clear liquid diet and her diet was gradually advanced back to a diabetic  diet during the course of postoperative day #1. On postoperative day #2, the  patient's Foley catheter was able to be removed. Her retroperitoneal drain  output was monitored. It was sent for a creatinine level and was found to be  consistent with serum and was thus able to be removed. In addition, on  postoperative day #2, she was noted to have right calf tenderness. A duplex  ultrasound of the right lower extremity was performed and was negative for a  DVT. By postoperative day #3, she was otherwise doing  well except was still  noted to be requiring supplemental oxygen. Her oxygen was gradually weaned  as she began ambulating and was encouraged to perform incentive spirometry.  By postoperative day #4, her oxygen saturation levels were 99% on room air  and she was able to be discharged home in good condition.   DISPOSITION:  Home.   DISCHARGE MEDICATIONS:  Ms. [name] was instructed to resume her regular home  medications except for any aspirin, nonsteroidal anti-inflammatory drugs, or  herbal supplements for 10 days. She was given a prescription for Vicodin to  take as needed for pain. She was also instructed to take Colace as a stool  softener.   DISCHARGE INSTRUCTIONS:  The patient was instructed on the signs and  symptoms of wound infection and told to call should she have any problems.  She was also encouraged to be ambulatory but instructed to refrain from any  heavy lifting or strenuous activity. She was instructed to resume her  regular diabetic diet at home.   FOLLOW-UP:  Ms. [name] was set up to follow-up in  1 week for staple removal.  She also will come in at that time to further discuss her surgical pathology  in detail. 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           ______________________________  Heloise Purpura, MD  Electronically Signed     LB/MEDQ  D:  03/02/2005  T:  03/02/2005  Job:  [id number]   cc:   Burnell Blanks, MD

## 2010-11-06 ENCOUNTER — Other Ambulatory Visit: Payer: Self-pay | Admitting: Family Medicine

## 2010-11-06 DIAGNOSIS — M542 Cervicalgia: Secondary | ICD-10-CM

## 2010-11-09 ENCOUNTER — Ambulatory Visit
Admission: RE | Admit: 2010-11-09 | Discharge: 2010-11-09 | Disposition: A | Discharge status: Home / Self Care | Payer: Medicare Other | Source: Ambulatory Visit | Attending: Family Medicine | Admitting: Family Medicine

## 2010-11-09 DIAGNOSIS — M542 Cervicalgia: Secondary | ICD-10-CM

## 2010-12-28 ENCOUNTER — Other Ambulatory Visit: Payer: Self-pay | Admitting: Neurosurgery

## 2011-02-08 ENCOUNTER — Encounter (HOSPITAL_COMMUNITY): Payer: Self-pay

## 2011-02-20 ENCOUNTER — Inpatient Hospital Stay (HOSPITAL_COMMUNITY): Admission: RE | Admit: 2011-02-20 | Payer: Medicare Other | Source: Ambulatory Visit

## 2011-02-21 ENCOUNTER — Encounter (HOSPITAL_COMMUNITY): Payer: Self-pay

## 2011-02-21 ENCOUNTER — Ambulatory Visit (HOSPITAL_COMMUNITY)
Admission: RE | Admit: 2011-02-21 | Discharge: 2011-02-21 | Disposition: A | Discharge status: Home / Self Care | Payer: Medicare Other | Source: Ambulatory Visit | Attending: Anesthesiology | Admitting: Anesthesiology

## 2011-02-21 ENCOUNTER — Encounter (HOSPITAL_COMMUNITY)
Admission: RE | Admit: 2011-02-21 | Discharge: 2011-02-21 | Disposition: A | Discharge status: Home / Self Care | Payer: Medicare Other | Source: Ambulatory Visit | Attending: Neurosurgery | Admitting: Neurosurgery

## 2011-02-21 ENCOUNTER — Other Ambulatory Visit: Payer: Self-pay

## 2011-02-21 DIAGNOSIS — Z01812 Encounter for preprocedural laboratory examination: Secondary | ICD-10-CM | POA: Insufficient documentation

## 2011-02-21 DIAGNOSIS — Z01818 Encounter for other preprocedural examination: Secondary | ICD-10-CM | POA: Insufficient documentation

## 2011-02-21 HISTORY — DX: Major depressive disorder, single episode, unspecified: F32.9

## 2011-02-21 HISTORY — DX: Unspecified osteoarthritis, unspecified site: M19.90

## 2011-02-21 HISTORY — DX: Hypothyroidism, unspecified: E03.9

## 2011-02-21 HISTORY — DX: Reserved for inherently not codable concepts without codable children: IMO0001

## 2011-02-21 HISTORY — DX: Headache: R51

## 2011-02-21 HISTORY — DX: Encounter for other specified aftercare: Z51.89

## 2011-02-21 HISTORY — DX: Personal history of other diseases of the digestive system: Z87.19

## 2011-02-21 HISTORY — DX: Disorder of kidney and ureter, unspecified: N28.9

## 2011-02-21 HISTORY — DX: Depression, unspecified: F32.A

## 2011-02-21 HISTORY — DX: Hematuria, unspecified: R31.9

## 2011-02-21 HISTORY — DX: Gastro-esophageal reflux disease without esophagitis: K21.9

## 2011-02-21 HISTORY — DX: Angina pectoris, unspecified: I20.9

## 2011-02-21 HISTORY — DX: Disorder of lipoprotein metabolism, unspecified: E78.9

## 2011-02-21 HISTORY — DX: Essential (primary) hypertension: I10

## 2011-02-21 HISTORY — DX: Anemia, unspecified: D64.9

## 2011-02-21 HISTORY — DX: Pneumonia, unspecified organism: J18.9

## 2011-02-21 HISTORY — DX: Anxiety disorder, unspecified: F41.9

## 2011-02-21 LAB — BASIC METABOLIC PANEL
GFR calc Af Amer: >90 mL/min (ref >90)
GFR calc non Af Amer: 84 mL/min — ABNORMAL LOW (ref >90)
Glucose, Bld: 112 mg/dL — ABNORMAL HIGH (ref 70–99)
Potassium: 3.2 mEq/L — ABNORMAL LOW (ref 3.5–5.1)
Sodium: 141 mEq/L (ref 135–145)

## 2011-02-21 LAB — CBC
Hemoglobin: 12 g/dL (ref 12.0–15.0)
RBC: 4.34 MIL/uL (ref 3.87–5.11)

## 2011-02-21 LAB — SURGICAL PCR SCREEN
MRSA, PCR: POSITIVE — AB
Staphylococcus aureus: POSITIVE — AB

## 2011-02-21 NOTE — Pre-Procedure Instructions (Signed)
[address] SELENIA MIHOK  02/21/2011   Your procedure is scheduled on:  02/23/11  Report to [facility] at 5:30 AM.  Call this number if you have problems the morning of surgery: [phone]   Remember: Discontinue Aspirin, Coumadin, Plavix, Effient and herbal medications.   Do not eat food:After Midnight.  May have clear liquids: up to 4 Hours before arrival (1:30 AM).  Clear liquids include soda, tea, black coffee, apple or grape juice, broth.  Take these medicines the morning of surgery with A SIP OF WATER: Coreg*, Levothyroxine   Do not wear jewelry, make-up or nail polish.  Do not wear lotions, powders, or perfumes. You may wear deodorant.  Do not shave 48 hours prior to surgery.  Do not bring valuables to the hospital.  Contacts, dentures or bridgework may not be worn into surgery.  Leave suitcase in the car. After surgery it may be brought to your room.  For patients admitted to the hospital, checkout time is 11:00 AM the day of discharge.   Patients discharged the day of surgery will not be allowed to drive home.  Name and phone number of your driver: Being admitted  Special Instructions: CHG Shower Use Special Wash: 1/2 bottle night before surgery and 1/2 bottle morning of surgery.   Please read over the following fact sheets that you were given: Pain Booklet, Coughing and Deep Breathing, MRSA Information and Surgical Site Infection Prevention

## 2011-02-21 NOTE — Progress Notes (Addendum)
States she had routine stress test/ECHO at [facility] 5-6 yrs ago-okay. Doesn't see cardiologist. Denies cardiac cath.   Chart to Shonna Chock PA for EKG review.

## 2011-02-22 MED ORDER — CEFAZOLIN SODIUM-DEXTROSE 2-3 GM-% IV SOLR
2.0000 g | INTRAVENOUS | Status: AC
  Administered 2011-02-23: 2 g via INTRAVENOUS
  Filled 2011-02-22 (×2): qty 50

## 2011-02-22 NOTE — Consult Note (Signed)
Anesthesia:  Patient is a 71 year old female scheduled for a C4-5 corpectomy, C3-6 ACDF on 02/23/11.  History includes HTN, hypothyroidism, non-smoker, DM2, anxiety, HH, glaucoma, depression, anemia with history of a transfusion, arthritis, hypercholesterolemia, kidney lesion.  (Angina is also listed in her history although her PAT nurse said she did not report any recent symptoms to her.  Patient also denied chest pain when I spoke with her today.  She also denies SOB and LE edema.  She is able to do housework and go up stairs without significant difficulty--although her knee and back pain do limit her some.)  Labs reviewed.  CXR shows no active cardiopulmonary disease.  EKG NSR, septal infarct (age undetermined).  Question if leads V2 and V3 are reversed.  Will repeat on arrival.  She had a finding of possible septal infarct dating back to 2007.  She also reports a prior stress test.  I called her PCP office, Dr. Burnell Blanks [phone]) for records.  Her EKG from her office done in 2008 shows similar anterior lead findings (which were even documented as old from 2005).  A treadmill echocardiogram stress test from [facility] in August 2005 showed no ischemia, normal LV function, EF 74%.  Anticipate that she can proceed.

## 2011-02-23 ENCOUNTER — Ambulatory Visit (HOSPITAL_COMMUNITY): Payer: Medicare Other

## 2011-02-23 ENCOUNTER — Ambulatory Visit (HOSPITAL_COMMUNITY): Payer: Medicare Other | Admitting: Vascular Surgery

## 2011-02-23 ENCOUNTER — Inpatient Hospital Stay (HOSPITAL_COMMUNITY)
Admission: RE | Admit: 2011-02-23 | Discharge: 2011-03-06 | DRG: 455 | Disposition: A | Discharge status: Skilled Nursing Facility | Payer: Medicare Other | Source: Ambulatory Visit | Attending: Neurosurgery | Admitting: Neurosurgery

## 2011-02-23 ENCOUNTER — Encounter (HOSPITAL_COMMUNITY)
Admission: RE | Disposition: A | Discharge status: Skilled Nursing Facility | Payer: Self-pay | Source: Ambulatory Visit | Attending: Neurosurgery

## 2011-02-23 ENCOUNTER — Encounter (HOSPITAL_COMMUNITY): Payer: Self-pay | Admitting: *Deleted

## 2011-02-23 ENCOUNTER — Other Ambulatory Visit: Payer: Self-pay

## 2011-02-23 ENCOUNTER — Encounter (HOSPITAL_COMMUNITY): Payer: Self-pay | Admitting: Vascular Surgery

## 2011-02-23 DIAGNOSIS — T888XXA Other specified complications of surgical and medical care, not elsewhere classified, initial encounter: Secondary | ICD-10-CM | POA: Diagnosis not present

## 2011-02-23 DIAGNOSIS — Y9210 Unspecified residential institution as the place of occurrence of the external cause: Secondary | ICD-10-CM | POA: Diagnosis not present

## 2011-02-23 DIAGNOSIS — E119 Type 2 diabetes mellitus without complications: Secondary | ICD-10-CM | POA: Diagnosis present

## 2011-02-23 DIAGNOSIS — Z888 Allergy status to other drugs, medicaments and biological substances status: Secondary | ICD-10-CM

## 2011-02-23 DIAGNOSIS — Z882 Allergy status to sulfonamides status: Secondary | ICD-10-CM

## 2011-02-23 DIAGNOSIS — Z79899 Other long term (current) drug therapy: Secondary | ICD-10-CM

## 2011-02-23 DIAGNOSIS — Y849 Medical procedure, unspecified as the cause of abnormal reaction of the patient, or of later complication, without mention of misadventure at the time of the procedure: Secondary | ICD-10-CM | POA: Diagnosis not present

## 2011-02-23 DIAGNOSIS — Z8249 Family history of ischemic heart disease and other diseases of the circulatory system: Secondary | ICD-10-CM

## 2011-02-23 DIAGNOSIS — Z833 Family history of diabetes mellitus: Secondary | ICD-10-CM

## 2011-02-23 DIAGNOSIS — M4712 Other spondylosis with myelopathy, cervical region: Principal | ICD-10-CM | POA: Diagnosis present

## 2011-02-23 DIAGNOSIS — I10 Essential (primary) hypertension: Secondary | ICD-10-CM | POA: Diagnosis present

## 2011-02-23 HISTORY — PX: ANTERIOR CERVICAL CORPECTOMY: SHX1159

## 2011-02-23 LAB — GLUCOSE, CAPILLARY
Glucose-Capillary: 151 mg/dL — ABNORMAL HIGH (ref 70–99)
Glucose-Capillary: 155 mg/dL — ABNORMAL HIGH (ref 70–99)

## 2011-02-23 SURGERY — ANTERIOR CERVICAL CORPECTOMY
Anesthesia: General

## 2011-02-23 MED ORDER — ACETAMINOPHEN 325 MG PO TABS
650.0000 mg | ORAL_TABLET | ORAL | Status: DC | PRN
  Filled 2011-02-23: qty 2

## 2011-02-23 MED ORDER — ONDANSETRON HCL 4 MG/2ML IJ SOLN: 4.0000 mg | Freq: Once | INTRAMUSCULAR | Status: DC | PRN

## 2011-02-23 MED ORDER — MORPHINE SULFATE 4 MG/ML IJ SOLN: 1.0000 mg | INTRAMUSCULAR | Status: DC | PRN

## 2011-02-23 MED ORDER — LACTATED RINGERS IV SOLN
INTRAVENOUS | Status: DC | PRN
  Administered 2011-02-23: 07:00:00 via INTRAVENOUS

## 2011-02-23 MED ORDER — BACITRACIN 50000 UNITS IM SOLR
INTRAMUSCULAR | Status: AC
  Filled 2011-02-23: qty 1

## 2011-02-23 MED ORDER — ROCURONIUM BROMIDE 100 MG/10ML IV SOLN
INTRAVENOUS | Status: DC | PRN
  Administered 2011-02-23: 10 mg via INTRAVENOUS
  Administered 2011-02-23: 40 mg via INTRAVENOUS

## 2011-02-23 MED ORDER — LIDOCAINE-EPINEPHRINE 0.5-1:200000 % IJ SOLN
INTRAMUSCULAR | Status: DC | PRN
  Administered 2011-02-23: 5 mL

## 2011-02-23 MED ORDER — LIDOCAINE HCL (CARDIAC) 20 MG/ML IV SOLN
INTRAVENOUS | Status: DC | PRN
  Administered 2011-02-23: 20 mg via INTRAVENOUS

## 2011-02-23 MED ORDER — SENNA 8.6 MG PO TABS
1.0000 | ORAL_TABLET | Freq: Two times a day (BID) | ORAL | Status: DC
  Administered 2011-02-24 – 2011-03-05 (×18): 8.6 mg via ORAL
  Filled 2011-02-23 (×23): qty 1

## 2011-02-23 MED ORDER — DEXAMETHASONE SODIUM PHOSPHATE 4 MG/ML IJ SOLN
4.0000 mg | Freq: Once | INTRAMUSCULAR | Status: AC
  Administered 2011-02-23: 4 mg via INTRAVENOUS
  Filled 2011-02-23: qty 1

## 2011-02-23 MED ORDER — HYDROMORPHONE HCL PF 1 MG/ML IJ SOLN
0.2500 mg | INTRAMUSCULAR | Status: DC | PRN
  Administered 2011-02-23 (×3): 0.5 mg via INTRAVENOUS

## 2011-02-23 MED ORDER — METFORMIN HCL 500 MG PO TABS
1500.0000 mg | ORAL_TABLET | Freq: Every day | ORAL | Status: DC
  Administered 2011-02-24: 1500 mg via ORAL
  Filled 2011-02-23 (×3): qty 3

## 2011-02-23 MED ORDER — ADULT MULTIVITAMIN W/MINERALS CH
1.0000 | ORAL_TABLET | Freq: Every day | ORAL | Status: DC
  Administered 2011-02-24 – 2011-03-06 (×10): 1 via ORAL
  Filled 2011-02-23 (×11): qty 1

## 2011-02-23 MED ORDER — THROMBIN 20000 UNITS EX KIT
PACK | OROMUCOSAL | Status: DC | PRN
  Administered 2011-02-23: 07:00:00 via TOPICAL

## 2011-02-23 MED ORDER — PHENYLEPHRINE HCL 10 MG/ML IJ SOLN
10.0000 mg | INTRAVENOUS | Status: DC | PRN
  Administered 2011-02-23: 25 ug/min via INTRAVENOUS

## 2011-02-23 MED ORDER — SODIUM CHLORIDE 0.9 % IJ SOLN
3.0000 mL | Freq: Two times a day (BID) | INTRAMUSCULAR | Status: DC
  Administered 2011-02-23: 3 mL via INTRAVENOUS

## 2011-02-23 MED ORDER — LEVOTHYROXINE SODIUM 88 MCG PO TABS
88.0000 ug | ORAL_TABLET | Freq: Every day | ORAL | Status: DC
  Administered 2011-02-24 – 2011-03-06 (×10): 88 ug via ORAL
  Filled 2011-02-23 (×12): qty 1

## 2011-02-23 MED ORDER — MORPHINE SULFATE 2 MG/ML IJ SOLN
1.0000 mg | INTRAMUSCULAR | Status: DC | PRN
  Administered 2011-02-23: 2 mg via INTRAVENOUS
  Administered 2011-02-23 – 2011-02-24 (×4): 4 mg via INTRAVENOUS
  Administered 2011-02-24: 2 mg via INTRAVENOUS
  Administered 2011-02-24 – 2011-02-25 (×5): 4 mg via INTRAVENOUS
  Administered 2011-02-25: 2 mg via INTRAVENOUS
  Administered 2011-02-25 (×2): 4 mg via INTRAVENOUS
  Administered 2011-02-26 – 2011-03-01 (×9): 2 mg via INTRAVENOUS
  Administered 2011-03-01: 4 mg via INTRAVENOUS
  Administered 2011-03-01 – 2011-03-03 (×5): 2 mg via INTRAVENOUS
  Administered 2011-03-05: 4 mg via INTRAVENOUS
  Filled 2011-02-23: qty 2
  Filled 2011-02-23 (×2): qty 1
  Filled 2011-02-23 (×3): qty 2
  Filled 2011-02-23: qty 1
  Filled 2011-02-23 (×2): qty 2
  Filled 2011-02-23 (×2): qty 1
  Filled 2011-02-23: qty 2
  Filled 2011-02-23: qty 1
  Filled 2011-02-23: qty 2
  Filled 2011-02-23 (×2): qty 1
  Filled 2011-02-23 (×4): qty 2
  Filled 2011-02-23 (×3): qty 1
  Filled 2011-02-23 (×2): qty 2
  Filled 2011-02-23 (×2): qty 1
  Filled 2011-02-23: qty 2
  Filled 2011-02-23 (×2): qty 1

## 2011-02-23 MED ORDER — ACETAMINOPHEN 650 MG RE SUPP: 650.0000 mg | RECTAL | Status: DC | PRN

## 2011-02-23 MED ORDER — DEXAMETHASONE SODIUM PHOSPHATE 10 MG/ML IJ SOLN
INTRAMUSCULAR | Status: DC | PRN
  Administered 2011-02-23: 10 mg via INTRAVENOUS

## 2011-02-23 MED ORDER — CEFAZOLIN SODIUM 1-5 GM-% IV SOLN
1.0000 g | Freq: Three times a day (TID) | INTRAVENOUS | Status: AC
  Administered 2011-02-23 – 2011-02-24 (×2): 1 g via INTRAVENOUS
  Filled 2011-02-23 (×2): qty 50

## 2011-02-23 MED ORDER — POTASSIUM CHLORIDE IN NACL 20-0.9 MEQ/L-% IV SOLN
INTRAVENOUS | Status: DC
  Administered 2011-02-23: 18:00:00 via INTRAVENOUS
  Administered 2011-02-24: 1000 mL via INTRAVENOUS
  Administered 2011-02-25 – 2011-02-26 (×2): via INTRAVENOUS
  Filled 2011-02-23 (×9): qty 1000

## 2011-02-23 MED ORDER — ONDANSETRON HCL 4 MG/2ML IJ SOLN
INTRAMUSCULAR | Status: DC | PRN
  Administered 2011-02-23: 4 mg via INTRAVENOUS

## 2011-02-23 MED ORDER — SODIUM CHLORIDE 0.9 % IV SOLN
250.0000 mL | INTRAVENOUS | Status: DC
  Administered 2011-02-24: 250 mL via INTRAVENOUS

## 2011-02-23 MED ORDER — HYDROCODONE-ACETAMINOPHEN 5-325 MG PO TABS
1.0000 | ORAL_TABLET | ORAL | Status: DC | PRN
  Administered 2011-03-02 – 2011-03-06 (×5): 2 via ORAL
  Filled 2011-02-23 (×5): qty 2

## 2011-02-23 MED ORDER — MENTHOL 3 MG MT LOZG
1.0000 | LOZENGE | OROMUCOSAL | Status: DC | PRN
  Administered 2011-03-02: 3 mg via ORAL
  Filled 2011-02-23: qty 9

## 2011-02-23 MED ORDER — PHENYLEPHRINE HCL 10 MG/ML IJ SOLN
INTRAMUSCULAR | Status: DC | PRN
  Administered 2011-02-23 (×2): 80 ug via INTRAVENOUS

## 2011-02-23 MED ORDER — SODIUM CHLORIDE 0.9 % IJ SOLN: 3.0000 mL | INTRAMUSCULAR | Status: DC | PRN

## 2011-02-23 MED ORDER — HYDROMORPHONE HCL PF 1 MG/ML IJ SOLN
INTRAMUSCULAR | Status: AC
  Filled 2011-02-23: qty 1

## 2011-02-23 MED ORDER — VECURONIUM BROMIDE 10 MG IV SOLR
INTRAVENOUS | Status: DC | PRN
  Administered 2011-02-23 (×3): 2 mg via INTRAVENOUS
  Administered 2011-02-23: 1 mg via INTRAVENOUS
  Administered 2011-02-23: 3 mg via INTRAVENOUS

## 2011-02-23 MED ORDER — DIAZEPAM 5 MG PO TABS
5.0000 mg | ORAL_TABLET | Freq: Four times a day (QID) | ORAL | Status: DC | PRN
  Administered 2011-02-24 – 2011-03-05 (×17): 5 mg via ORAL
  Filled 2011-02-23 (×18): qty 1

## 2011-02-23 MED ORDER — ALBUMIN HUMAN 5 % IV SOLN
INTRAVENOUS | Status: DC | PRN
  Administered 2011-02-23 (×2): via INTRAVENOUS

## 2011-02-23 MED ORDER — CARVEDILOL 3.125 MG PO TABS
3.1250 mg | ORAL_TABLET | Freq: Every day | ORAL | Status: DC
  Administered 2011-02-24 – 2011-03-06 (×10): 3.125 mg via ORAL
  Filled 2011-02-23 (×12): qty 1

## 2011-02-23 MED ORDER — FENTANYL CITRATE 0.05 MG/ML IJ SOLN
INTRAMUSCULAR | Status: DC | PRN
  Administered 2011-02-23 (×2): 50 ug via INTRAVENOUS
  Administered 2011-02-23: 25 ug via INTRAVENOUS
  Administered 2011-02-23: 50 ug via INTRAVENOUS
  Administered 2011-02-23: 75 ug via INTRAVENOUS

## 2011-02-23 MED ORDER — PHENOL 1.4 % MT LIQD: 1.0000 | OROMUCOSAL | Status: DC | PRN

## 2011-02-23 MED ORDER — GLIPIZIDE ER 5 MG PO TB24
5.0000 mg | ORAL_TABLET | Freq: Every day | ORAL | Status: DC
  Administered 2011-02-24 – 2011-03-06 (×10): 5 mg via ORAL
  Filled 2011-02-23 (×12): qty 1

## 2011-02-23 MED ORDER — EPHEDRINE SULFATE 50 MG/ML IJ SOLN
INTRAMUSCULAR | Status: DC | PRN
  Administered 2011-02-23: 10 mg via INTRAVENOUS
  Administered 2011-02-23: 5 mg via INTRAVENOUS
  Administered 2011-02-23 (×2): 10 mg via INTRAVENOUS

## 2011-02-23 MED ORDER — ONDANSETRON HCL 4 MG/2ML IJ SOLN: 4.0000 mg | INTRAMUSCULAR | Status: DC | PRN

## 2011-02-23 MED ORDER — HYDROCHLOROTHIAZIDE 25 MG PO TABS
25.0000 mg | ORAL_TABLET | Freq: Every day | ORAL | Status: DC
  Administered 2011-02-24 – 2011-03-05 (×9): 25 mg via ORAL
  Filled 2011-02-23 (×11): qty 1

## 2011-02-23 MED ORDER — ZOLPIDEM TARTRATE 5 MG PO TABS
5.0000 mg | ORAL_TABLET | Freq: Every evening | ORAL | Status: DC | PRN
  Administered 2011-02-27: 5 mg via ORAL
  Filled 2011-02-23: qty 1

## 2011-02-23 MED ORDER — ROSUVASTATIN CALCIUM 5 MG PO TABS
5.0000 mg | ORAL_TABLET | Freq: Every day | ORAL | Status: DC
  Administered 2011-02-23 – 2011-03-05 (×11): 5 mg via ORAL
  Filled 2011-02-23 (×12): qty 1

## 2011-02-23 MED ORDER — LIDOCAINE HCL 4 % MT SOLN
OROMUCOSAL | Status: DC | PRN
  Administered 2011-02-23: 4 mL via TOPICAL

## 2011-02-23 MED ORDER — DEXAMETHASONE 4 MG PO TABS: 4.0000 mg | ORAL_TABLET | Freq: Once | ORAL | Status: AC

## 2011-02-23 MED ORDER — HYDROCHLOROTHIAZIDE 25 MG PO TABS
25.0000 mg | ORAL_TABLET | Freq: Every day | ORAL | Status: DC
  Filled 2011-02-23: qty 1

## 2011-02-23 MED ORDER — HEMOSTATIC AGENTS (NO CHARGE) OPTIME
TOPICAL | Status: DC | PRN
  Administered 2011-02-23: 1 via TOPICAL

## 2011-02-23 MED ORDER — SODIUM CHLORIDE 0.9 % IV SOLN
INTRAVENOUS | Status: AC
  Filled 2011-02-23: qty 500

## 2011-02-23 MED ORDER — LOSARTAN POTASSIUM 50 MG PO TABS
100.0000 mg | ORAL_TABLET | Freq: Every day | ORAL | Status: DC
  Administered 2011-02-24 – 2011-03-06 (×10): 100 mg via ORAL
  Filled 2011-02-23 (×11): qty 2

## 2011-02-23 MED ORDER — OXYCODONE-ACETAMINOPHEN 5-325 MG PO TABS
1.0000 | ORAL_TABLET | ORAL | Status: DC | PRN
  Administered 2011-02-23: 1 via ORAL
  Administered 2011-02-24 – 2011-03-06 (×31): 2 via ORAL
  Filled 2011-02-23: qty 2
  Filled 2011-02-23: qty 1
  Filled 2011-02-23 (×31): qty 2

## 2011-02-23 MED ORDER — LOSARTAN POTASSIUM-HCTZ 100-25 MG PO TABS: 1.0000 | ORAL_TABLET | Freq: Every day | ORAL | Status: DC

## 2011-02-23 MED ORDER — LACTATED RINGERS IV SOLN
INTRAVENOUS | Status: DC | PRN
  Administered 2011-02-23 (×2): via INTRAVENOUS

## 2011-02-23 MED ORDER — 0.9 % SODIUM CHLORIDE (POUR BTL) OPTIME
TOPICAL | Status: DC | PRN
  Administered 2011-02-23: 1000 mL

## 2011-02-23 MED ORDER — CHLORHEXIDINE GLUCONATE CLOTH 2 % EX PADS: 6.0000 | MEDICATED_PAD | Freq: Every day | CUTANEOUS | Status: DC

## 2011-02-23 MED ORDER — HYDROMORPHONE HCL PF 1 MG/ML IJ SOLN
INTRAMUSCULAR | Status: AC
  Administered 2011-02-23: 0.5 mg via INTRAVENOUS
  Filled 2011-02-23: qty 1

## 2011-02-23 MED ORDER — MUPIROCIN 2 % EX OINT
1.0000 "application " | TOPICAL_OINTMENT | Freq: Two times a day (BID) | CUTANEOUS | Status: AC
  Administered 2011-02-23 – 2011-02-27 (×9): 1 via NASAL
  Filled 2011-02-23: qty 22

## 2011-02-23 MED ORDER — PROPOFOL 10 MG/ML IV EMUL
INTRAVENOUS | Status: DC | PRN
  Administered 2011-02-23: 200 mg via INTRAVENOUS

## 2011-02-23 MED ORDER — MORPHINE SULFATE 2 MG/ML IJ SOLN
INTRAMUSCULAR | Status: AC
  Administered 2011-02-23: 2 mg via INTRAVENOUS
  Filled 2011-02-23: qty 1

## 2011-02-23 SURGICAL SUPPLY — 84 items
ADH SKN CLS APL DERMABOND .7 (GAUZE/BANDAGES/DRESSINGS) ×1
BANDAGE GAUZE ELAST BULKY 4 IN (GAUZE/BANDAGES/DRESSINGS) IMPLANT
BIT DRILL 2.3 12 FIXED (INSTRUMENTS) IMPLANT
BIT DRILL NEURO 2X3.1 SFT TUCH (MISCELLANEOUS) ×1 IMPLANT
BLADE SURG ROTATE 9660 (MISCELLANEOUS) IMPLANT
BUR BARREL STRAIGHT FLUTE 4.0 (BURR) ×1 IMPLANT
BUR ROUND FLUTED 4 SOFT TCH (BURR) ×1 IMPLANT
CAGE CORPECTOMY 42MM (Cage) ×1 IMPLANT
CANISTER SUCTION 2500CC (MISCELLANEOUS) ×2 IMPLANT
CLOTH BEACON ORANGE TIMEOUT ST (SAFETY) ×2 IMPLANT
CONT SPEC 4OZ CLIKSEAL STRL BL (MISCELLANEOUS) ×2 IMPLANT
DECANTER SPIKE VIAL GLASS SM (MISCELLANEOUS) ×2 IMPLANT
DERMABOND ADVANCED (GAUZE/BANDAGES/DRESSINGS) ×1
DERMABOND ADVANCED .7 DNX12 (GAUZE/BANDAGES/DRESSINGS) ×1 IMPLANT
DRAPE LAPAROTOMY 100X72 PEDS (DRAPES) ×2 IMPLANT
DRAPE MICROSCOPE LEICA (MISCELLANEOUS) ×2 IMPLANT
DRAPE POUCH INSTRU U-SHP 10X18 (DRAPES) ×2 IMPLANT
DRAPE PROXIMA HALF (DRAPES) IMPLANT
DRILL 12MM (INSTRUMENTS) ×2
DRILL NEURO 2X3.1 SOFT TOUCH (MISCELLANEOUS) ×2
DURAPREP 6ML APPLICATOR 50/CS (WOUND CARE) ×1 IMPLANT
ELECT COATED BLADE 2.86 ST (ELECTRODE) ×2 IMPLANT
ELECT REM PT RETURN 9FT ADLT (ELECTROSURGICAL) ×2
ELECTRODE REM PT RTRN 9FT ADLT (ELECTROSURGICAL) ×1 IMPLANT
GAUZE SPONGE 4X4 16PLY XRAY LF (GAUZE/BANDAGES/DRESSINGS) IMPLANT
GLOVE BIO SURGEON STRL SZ 6.5 (GLOVE) IMPLANT
GLOVE BIO SURGEON STRL SZ7 (GLOVE) IMPLANT
GLOVE BIO SURGEON STRL SZ7.5 (GLOVE) IMPLANT
GLOVE BIO SURGEON STRL SZ8 (GLOVE) ×1 IMPLANT
GLOVE BIO SURGEON STRL SZ8.5 (GLOVE) IMPLANT
GLOVE BIOGEL M 8.0 STRL (GLOVE) IMPLANT
GLOVE BIOGEL PI IND STRL 6.5 (GLOVE) IMPLANT
GLOVE BIOGEL PI IND STRL 8 (GLOVE) IMPLANT
GLOVE BIOGEL PI IND STRL 8.5 (GLOVE) IMPLANT
GLOVE BIOGEL PI INDICATOR 6.5 (GLOVE) ×2
GLOVE BIOGEL PI INDICATOR 8 (GLOVE) ×2
GLOVE BIOGEL PI INDICATOR 8.5 (GLOVE) ×2
GLOVE ECLIPSE 6.5 STRL STRAW (GLOVE) ×3 IMPLANT
GLOVE ECLIPSE 7.0 STRL STRAW (GLOVE) IMPLANT
GLOVE ECLIPSE 7.5 STRL STRAW (GLOVE) IMPLANT
GLOVE ECLIPSE 8.0 STRL XLNG CF (GLOVE) IMPLANT
GLOVE ECLIPSE 8.5 STRL (GLOVE) IMPLANT
GLOVE EXAM NITRILE LRG STRL (GLOVE) IMPLANT
GLOVE EXAM NITRILE MD LF STRL (GLOVE) ×1 IMPLANT
GLOVE EXAM NITRILE XL STR (GLOVE) IMPLANT
GLOVE EXAM NITRILE XS STR PU (GLOVE) IMPLANT
GLOVE INDICATOR 6.5 STRL GRN (GLOVE) IMPLANT
GLOVE INDICATOR 7.0 STRL GRN (GLOVE) ×2 IMPLANT
GLOVE INDICATOR 7.5 STRL GRN (GLOVE) IMPLANT
GLOVE INDICATOR 8.0 STRL GRN (GLOVE) IMPLANT
GLOVE INDICATOR 8.5 STRL (GLOVE) ×2 IMPLANT
GLOVE OPTIFIT SS 8.0 STRL (GLOVE) IMPLANT
GLOVE SURG SS PI 6.5 STRL IVOR (GLOVE) IMPLANT
GLOVE SURG SS PI 8.5 STRL IVOR (GLOVE) ×3
GLOVE SURG SS PI 8.5 STRL STRW (GLOVE) IMPLANT
GOWN BRE IMP SLV AUR LG STRL (GOWN DISPOSABLE) ×4 IMPLANT
GOWN BRE IMP SLV AUR XL STRL (GOWN DISPOSABLE) ×1 IMPLANT
GOWN STRL REIN 2XL LVL4 (GOWN DISPOSABLE) ×3 IMPLANT
HEAD HALTER (SOFTGOODS) ×2 IMPLANT
HEMOSTAT SURGICEL 2X14 (HEMOSTASIS) IMPLANT
KIT BASIN OR (CUSTOM PROCEDURE TRAY) ×2 IMPLANT
KIT ROOM TURNOVER OR (KITS) ×2 IMPLANT
NDL HYPO 25X1 1.5 SAFETY (NEEDLE) ×1 IMPLANT
NDL SPNL 22GX3.5 QUINCKE BK (NEEDLE) ×1 IMPLANT
NEEDLE HYPO 25X1 1.5 SAFETY (NEEDLE) ×2 IMPLANT
NEEDLE SPNL 22GX3.5 QUINCKE BK (NEEDLE) ×2 IMPLANT
NS IRRIG 1000ML POUR BTL (IV SOLUTION) ×2 IMPLANT
PACK LAMINECTOMY NEURO (CUSTOM PROCEDURE TRAY) ×2 IMPLANT
PAD ARMBOARD 7.5X6 YLW CONV (MISCELLANEOUS) ×2 IMPLANT
PIN DISTRACTION 14MM (PIN) ×4 IMPLANT
PLATE 51MM (Plate) ×2 IMPLANT
PLATE 51X3 LVL NS SPNE CVD (Plate) IMPLANT
RUBBERBAND STERILE (MISCELLANEOUS) ×4 IMPLANT
SCREW 12MM (Screw) ×4 IMPLANT
SPONGE INTESTINAL PEANUT (DISPOSABLE) ×2 IMPLANT
SPONGE SURGIFOAM ABS GEL 100 (HEMOSTASIS) ×1 IMPLANT
SUT VIC AB 0 CT1 27 (SUTURE) ×2
SUT VIC AB 0 CT1 27XBRD ANTBC (SUTURE) ×1 IMPLANT
SUT VIC AB 3-0 SH 8-18 (SUTURE) ×2 IMPLANT
SYR 20ML ECCENTRIC (SYRINGE) ×2 IMPLANT
TOWEL OR 17X24 6PK STRL BLUE (TOWEL DISPOSABLE) ×2 IMPLANT
TOWEL OR 17X26 10 PK STRL BLUE (TOWEL DISPOSABLE) ×2 IMPLANT
TRAY FOLEY CATH 14FRSI W/METER (CATHETERS) ×1 IMPLANT
WATER STERILE IRR 1000ML POUR (IV SOLUTION) ×2 IMPLANT

## 2011-02-23 NOTE — Preoperative (Signed)
Beta Blockers   Reason not to administer Beta Blockers:Not Applicable 

## 2011-02-23 NOTE — Anesthesia Preprocedure Evaluation (Signed)
Anesthesia Evaluation    History of Anesthesia Complications (+) AWARENESS UNDER ANESTHESIA  Airway       Dental   Pulmonary          Cardiovascular     Neuro/Psych    GI/Hepatic   Endo/Other    Renal/GU      Musculoskeletal   Abdominal   Peds  Hematology   Anesthesia Other Findings   Reproductive/Obstetrics                           Anesthesia Physical Anesthesia Plan  ASA: II  Anesthesia Plan: General   Post-op Pain Management:    Induction: Intravenous  Airway Management Planned: Oral ETT  Additional Equipment:   Intra-op Plan:   Post-operative Plan:   Informed Consent: I have reviewed the patients History and Physical, chart, labs and discussed the procedure including the risks, benefits and alternatives for the proposed anesthesia with the patient or authorized representative who has indicated his/her understanding and acceptance.     Plan Discussed with: CRNA, Anesthesiologist and Surgeon  Anesthesia Plan Comments:         Anesthesia Quick Evaluation

## 2011-02-23 NOTE — Anesthesia Postprocedure Evaluation (Signed)
  Anesthesia Post-op Note  Patient: [name]  Procedure(s) Performed: Procedure(s) (LRB): ANTERIOR CERVICAL CORPECTOMY (N/A)  Patient Location: PACU  Anesthesia Type: General  Level of Consciousness: awake, oriented, sedated and patient cooperative  Airway and Oxygen Therapy: Patient Spontanous Breathing and Patient connected to nasal cannula oxygen  Post-op Pain: mild  Post-op Assessment: Post-op Vital signs reviewed, Patient's Cardiovascular Status Stable, Respiratory Function Stable, Patent Airway, No signs of Nausea or vomiting and Pain level controlled  Post-op Vital Signs: stable  Complications: soft tissue trauma

## 2011-02-23 NOTE — Transfer of Care (Signed)
Immediate Anesthesia Transfer of Care Note  Patient: [name]  Procedure(s) Performed: Procedure(s) (LRB): ANTERIOR CERVICAL CORPECTOMY (N/A)  Patient Location: PACU  Anesthesia Type: General  Level of Consciousness: sedated and patient cooperative  Airway & Oxygen Therapy: Patient Spontanous Breathing and Patient connected to face mask oxygen  Post-op Assessment: Report given to PACU RN and Post -op Vital signs reviewed and stable  Post vital signs: Reviewed and stable  Complications: No apparent anesthesia complications

## 2011-02-23 NOTE — Progress Notes (Signed)
Patient ID: [name], female   DOB: Mar 02, 1940, 71 y.o.   MRN: [id number] BP 140/78  Pulse 91  Temp(Src) 98.4 F (36.9 C) (Oral)  Resp 18  Ht 5\' 5"  (1.651 m)  Wt 89.2 kg (196 lb 10.4 oz)  BMI 32.72 kg/m2  SpO2 100% Alert and oriented x 4. Speech clear, fluent. Voice strong. Moving all extremities. Weakness in upper extremities, at baseline. Unable to fully abduct right shoulder. Does have intrinsic shoulder problems. Wound is clean and dry. No signs infection.  Doing well postop. Or planned for Wednesday for Posterior cervical fusion.

## 2011-02-23 NOTE — H&P (Signed)
BP 114/78  Pulse 61  Temp(Src) 98 F (36.7 C) (Oral)  Resp 18  SpO2 100% [name] presents today for evaluation of cervical pain.  This is the same pain that she had four years ago. She stated that four years ago after she saw me that her husband subsequently passed.  She did see Dr. Myrtie Neither, but refused to get an injection for the shoulders.  She has always maintained that a lot of her pain is in the neck.  I have always maintained that the pain is stemming from both, neck and shoulders.  She is 71 years of age, right-handed, and currently retired.  She also reports numbness and tingling in the fingers, leg, and in the lower back, some bladder incontinence.  She does have headaches, pain in her arm, neck, leg, and back.  When she awakens in the morning she can barely stand for any length of time.  Her right hand is painful. She cannot lift to reach or close her hand tightly.    PAST MEDICAL HISTORY:  Significant for hypertension, diabetes, gastrointestinal problems. She has undergone an appendectomy, hysterectomy and thyroidectomy in the past, along with cholecystectomy, esophageal dilatation and a bladder tack-up.  She is ALLERGIC TO ASPIRIN, SULFANILAMIDE, CYMBALTA, NABUMETONE, CODEINE, AND NOVOCAINE.  Medications are Metformin, Glipizide, Carvedilol, Crestor, Losartan, Hydrochlorothiazide, Synthroid, and Centrum Silver.    FAMILY HISTORY:    Her mother and father are both deceased.  Hypertension, diabetes, and heart disease present.    SOCIAL HISTORY:    She does not smoke.  She does not use alcohol. She does not use illicit drugs.  She is 190 lbs. and is 5'5" tall. She has a pulse of 57.  Respiratory rate was 14.    PHYSICAL EXAMINATION:  On examination, she is alert, oriented x 4, and answering all questions appropriately.  Memory, language, attention span, and fund of knowledge are normal.  She is well kempt and in no distress. She has good strength in the upper extremities except  for grip which is about 4/5.  Normal muscle tone, bulk, and coordination.  Reflexes are 2+ at the biceps, triceps, brachioradialis, knees, and ankles.  Proprioception intact in both the upper and lower extremities.  She has a normal gait.  The pupils are equal, round, and reactive to light.  Full extraocular movements, full visual fields.  Hearing intact to finger rub    bilaterally.  Uvula elevates in the midline. Shoulder shrug is normal. Tongue protrudes in the midline.  No cervical masses or bruits.  Lung fields are clear.  Heart regular rhythm and rate, no murmurs or rubs.  Pulses are good at the wrists bilaterally.  No clubbing, cyanosis, or edema.  Oral mucosa is normal.  Sclerae not injected.    DIAGNOSTIC STUDIES:   MRI is reviewed.  At C3-4, 4-5, and 5-6 she has large disc herniations, but which I probably think at this point are osteophytes. She has ossification of the posterior longitudinal ligament, fairly severe spinal cord stenosis.  Spinal cord signal remains normal.  Alignment is otherwise normal. She is still lordotic.  CBC    Component Value Date/Time   WBC 5.8 02/21/2011 1558   RBC 4.34 02/21/2011 1558   HGB 12.0 02/21/2011 1558   HCT 36.8 02/21/2011 1558   PLT 359 02/21/2011 1558   MCV 84.8 02/21/2011 1558   MCH 27.6 02/21/2011 1558   MCHC 32.6 02/21/2011 1558   RDW 13.4 02/21/2011 1558    BMET  Component Value Date/Time   NA 141 02/21/2011 1558   K 3.2* 02/21/2011 1558   CL 102 02/21/2011 1558   CO2 31 02/21/2011 1558   GLUCOSE 112* 02/21/2011 1558   BUN 9 02/21/2011 1558   CREATININE 0.79 02/21/2011 1558   CALCIUM 10.0 02/21/2011 1558   GFRNONAA 84* 02/21/2011 1558   GFRAA >90 02/21/2011 1558    .DIAGNOSIS:     Cervical spondylosis with myelopathy, cervical stenosis, cervical radiculopathy.  Right upper extremity worse than left.  SUMMARY:      I still believe that she would need to undergo a cervical corpectomy of 4 and 5 and then a subsequent posterior spinal fusion from  C3 to C6.  She has not changed much from films done in 2008, which I also reviewed today in the office. She does have shoulder pathology, of that I have no doubt and may not be something that she needs to have operated on right now, but it is certainly something which is there. Risks and benefits including paralysis, need for further surgery(outside of planned posterior fusion next week), hardware failure, fusion failure, weakness, bowel/bladder dysfunction, incontinence, stroke,bleeding and infection were discussed along with other risks. She understands and wishes to proceed.

## 2011-02-23 NOTE — OR Nursing (Signed)
Arrived pacu with l UA iv infiltration from a former l AC IV which was removed PTA PACU. Extends from AC(wrappedarea with gauze) to to top of shoulder at [facility] joint/ elevated onm pillows and warm paks applied/ see assessment

## 2011-02-23 NOTE — Plan of Care (Signed)
Problem: Consults Goal: Diagnosis - Spinal Surgery Outcome: Completed/Met Date Met:  02/23/11 Cervical Spine Fusion

## 2011-02-23 NOTE — Op Note (Signed)
02/23/2011  2:13 PM  PATIENT:  Dickie La  71 y.o. female with a history of spinal stenosis, right upper extremity pain, and right shoulder pain. She has severe stenosis from C3 to C6 causing cord compression, weakness, and sensory changes in the upper extremities  PRE-OPERATIVE DIAGNOSIS:  cervical spondylosis with myelopathy cervical stenosis cervical three-thru six cervical radiculopathy  POST-OPERATIVE DIAGNOSIS:  cervical spondylosis with myelopathy cervical stenosis cervical three-thru six cervical radiculopathy  PROCEDURE:  Procedure(s): jStage 1 ANTERIOR CERVICAL CORPECTOMY C4, C5. Arhtrodesis C3-C6 with Peek interbody cage, morcellized autograft  SURGEON:  Surgeon(s): Carmela Hurt, MD Dorian Heckle, MD  ASSISTANTS:Stern  ANESTHESIA:   general  EBL:  Total I/O In: 3450 [I.V.:2950; IV Piggyback:500] Out: 1090 [Urine:390; Blood:700]  BLOOD ADMINISTERED:none  CELL SAVER GIVEN:none  COUNT:per nursing  DRAINS: none   SPECIMEN:  No Specimen  DICTATION: Mrs. Lottman was brought to the operating room for the first stage of a 2 part procedure. She was intubated and placed under a general anesthetic without difficulty. A foley catheter was placed under sterile conditions. Her head was placed on a horseshoe headrest in a  neutral position. Her neck was prepped and draped in a  Sterile manner. I opened the neck over the thyroid cartilage starting from the midline extending laterally to the medial margin of the sternocleidomastoid muscle. I dissected down to the platysma and opened it in a horizontal fashion. I then with both shArp and blunt technique created an avascular corridor to the cervical spine. I placed a spinal needle and with intraoperative xray confirmed my position. I reflected the longus colli muscles bilaterally and then placed self retaining retractors. I opened the disc spaces at C3/4, C4/5, C5/6 with a number 15 blade. I used curettes to remove more disc at  each level. I then with a leksell rongeur started my corpectomies of C4 and C5. When I was left with a thinner layer of bone overlying the dura I used the microscope and drill. I drilled the bone to a thin layer and removed it finally with Kerrison punches. I worked slowly over a number of large osteophytes to remove them with as little trauma to the cord. i fully decompressed the spinal canal from the C3/4 disc space to the C5/6 disc space. I also decompressed the nerve roots at each level.  I measured the space between the C3 and C6 vertebral bodies to place a peek cage. I packed a cage with autograft from the corpectomies and then placed it into the space. I had a good fit. I then moved on to the anterior instrumentation. I placed a 51mm Trestle plate without difficulty. Xray showed the plate And cage to be in good position. I had good hemostasis. I closed the wound in layers, approximating the platysma, then the subcuticular layer with vicryl suture. I used dermabond for a sterile dressing. Planned posterior fusion is scheduled for next week. Dr. Venetia Maxon assisted with the decompression.  PLAN OF CARE: Admit to inpatient   PATIENT DISPOSITION:  PACU - hemodynamically stable.   Delay start of Pharmacological VTE agent (>24hrs) due to surgical blood loss or risk of bleeding:  yes

## 2011-02-23 NOTE — Anesthesia Procedure Notes (Signed)
Procedure Name: Intubation Date/Time: 02/23/2011 8:33 AM Performed by: Malachi Pro Pre-anesthesia Checklist: Patient identified, Emergency Drugs available, Suction available, Patient being monitored and Timeout performed Patient Re-evaluated:Patient Re-evaluated prior to inductionOxygen Delivery Method: Circle System Utilized Preoxygenation: Pre-oxygenation with 100% oxygen Intubation Type: Combination inhalational/ intravenous induction Ventilation: Mask ventilation without difficulty and Oral airway inserted - appropriate to patient size Laryngoscope Size: Hyacinth Meeker and 2 Grade View: Grade II Tube type: Oral Tube size: 7.5 mm Number of attempts: 1 Airway Equipment and Method: stylet Placement Confirmation: ETT inserted through vocal cords under direct vision,  positive ETCO2 and breath sounds checked- equal and bilateral Secured at: 23 cm Tube secured with: Tape Dental Injury: Teeth and Oropharynx as per pre-operative assessment

## 2011-02-24 MED ORDER — CHLORHEXIDINE GLUCONATE CLOTH 2 % EX PADS
6.0000 | MEDICATED_PAD | Freq: Every day | CUTANEOUS | Status: DC
  Administered 2011-02-24 – 2011-03-01 (×5): 6 via TOPICAL

## 2011-02-24 NOTE — Progress Notes (Signed)
Subjective: Patient reports That she is improved from preop with her preoperative radicular symptoms. But still has a lot of discomfort in the left arm with the IV infiltrated.  Objective: Vital signs in last 24 hours: Temp:  [97.2 F (36.2 C)-99 F (37.2 C)] 98.7 F (37.1 C) (02/16 0700) Pulse Rate:  [72-95] 75  (02/16 0600) Resp:  [8-18] 11  (02/16 0600) BP: (113-150)/(62-80) 137/67 mmHg (02/16 0600) SpO2:  [91 %-100 %] 95 % (02/16 0600) Weight:  [89.2 kg (196 lb 10.4 oz)-92.6 kg (204 lb 2.3 oz)] 92.6 kg (204 lb 2.3 oz) (02/16 0600)  Intake/Output from previous day: 02/15 0701 - 02/16 0700 In: 4210 [P.O.:470; I.V.:3240; IV Piggyback:500] Out: 3060 [Urine:2360; Blood:700] Intake/Output this shift:    Baseline right deltoid weakness at about 2/5 grip strength seems to be symmetric at 4+ out of 5 lower surgery status out of 5 in wound is clean and dry. There is some sloughing off and blistering of her left arm from infiltration the IV but no compartment syndrome or significant swelling.  Lab Results:  [employer] 02/21/11 1558  WBC 5.8  HGB 12.0  HCT 36.8  PLT 359   BMET  Basename 02/21/11 1558  NA 141  K 3.2*  CL 102  CO2 31  GLUCOSE 112*  BUN 9  CREATININE 0.79  CALCIUM 10.0    Studies/Results: Dg Cervical Spine Complete  02/23/2011  *RADIOLOGY REPORT*  Clinical Data: Anterior cervical corpectomies C3-C6.  CERVICAL SPINE - COMPLETE 4+ VIEW  Comparison: None.  Findings: Lateral cervical spine intraoperative localizer a radiographs are submitted for interpretation.  The third images demonstrates localization needle in the C4-C5 interspace. Subsequent images demonstrate C3 through C6 plate with soft tissue retractors overlying the site of C4 and C5 probable corpectomy. Radiopaque wires are present in the anterior neck compatible with enteric tube and endotracheal tube.  IMPRESSION: Intraoperative localization as above.  Original Report Authenticated By: Andreas Newport, M.D.      Assessment/Plan: Neurologically making improvement from her ACDF. Will be getting a wound care consult to help manage her left upper extremity.  LOS: 1 day     Delania Ferg P 02/24/2011, 7:39 AM

## 2011-02-24 NOTE — Evaluation (Signed)
Occupational Therapy Evaluation Patient Details Name: [name] MRN: [id number] DOB: [birth year] Today's Date: 02/24/2011  Problem List:  Patient Active Problem List  Diagnoses  . DM  . GERD  . DIVERTICULOSIS-COLON  . DYSPHAGIA UNSPECIFIED  . ABDOMINAL PAIN, EPIGASTRIC  . Cervical spondylosis with myelopathy    Past Medical History:  Past Medical History  Diagnosis Date  . Angina   . Hypertension   . Abnormal cholesterol test     elevated  . Hypothyroidism   . Arthritis   . GERD (gastroesophageal reflux disease)   . H/O hiatal hernia   . Diabetes mellitus   . Glaucoma   . Urine blood     hx of-no diagnosis as to why  . Kidney lesion   . Anxiety   . Depression   . Headache   . Blood transfusion     with hysterectomy mid 1980s  . Pneumonia     walking pneumonia twice -10 yrs ago  . Anemia    Past Surgical History:  Past Surgical History  Procedure Date  . Appendectomy   . Abdominal hysterectomy   . Thyroidectomy   . Cholecystectomy   . Kidney surgery     lesion removed     OT Assessment/Plan/Recommendation OT Assessment Clinical Impression Statement: Pt. presents s/p Anterior cervical corpectomies C3-C6 and with increased pain and decreased bil UE strength and functional independence with ADLs. Pt. will benefit from skilled OT to increase functional independence to min assist-supervision at D/C to next venue of care OT Recommendation/Assessment: Patient will need skilled OT in the acute care venue OT Problem List: Decreased strength;Decreased activity tolerance;Impaired balance (sitting and/or standing);Decreased safety awareness;Decreased knowledge of use of DME or AE;Decreased knowledge of precautions;Impaired sensation;Impaired UE functional use;Pain;Increased edema Barriers to Discharge: Decreased caregiver support (son is not there all the time) OT Therapy Diagnosis : Generalized weakness;Acute pain OT Plan OT Frequency: Min 2X/week OT  Treatment/Interventions: Self-care/ADL training;Energy conservation;DME and/or AE instruction;Therapeutic activities;Patient/family education;Balance training OT Recommendation Recommendations for Other Services: Rehab consult Follow Up Recommendations: Inpatient Rehab Equipment Recommended: Defer to next venue Individuals Consulted Consulted and Agree with Results and Recommendations: Patient OT Goals Acute Rehab OT Goals OT Goal Formulation: With patient Time For Goal Achievement: 2 weeks ADL Goals Pt Will Perform Eating: with set-up;with supervision;with adaptive utensils;Sitting, chair ADL Goal: Eating - Progress: Goal set today Pt Will Perform Grooming: with set-up;with supervision;Standing at sink;with adaptive equipment ADL Goal: Grooming - Progress: Goal set today Pt Will Perform Upper Body Dressing: with min assist;Standing ADL Goal: Upper Body Dressing - Progress: Goal set today Pt Will Transfer to Toilet: with min assist;Ambulation;with DME;3-in-1 ADL Goal: Toilet Transfer - Progress: Goal set today  OT Evaluation Precautions/Restrictions  Precautions Precautions: Other (comment) (cervical) Required Braces or Orthoses: Yes Cervical Brace: Hard collar;Applied in supine position Restrictions Weight Bearing Restrictions: No Prior Functioning Home Living Lives With: Other (Comment) (grandson) Receives Help From: Family (intermittent assistance) Type of Home: House Home Layout: One level Home Access: Stairs to enter Entrance Stairs-Rails: None Entrance Stairs-Number of Steps: 3 Bathroom Shower/Tub: Forensic scientist: Standard Bathroom Accessibility: Yes How Accessible: Accessible via walker Home Adaptive Equipment: None Prior Function Level of Independence: Independent with basic ADLs;Independent with gait;Independent with transfers;Needs assistance with homemaking Vacuuming: Maximal Light Housekeeping: Moderate Able to Take Stairs?:  Yes Driving: Yes Vocation: Part time employment Vocation Requirements: [employer] ADL ADL Eating/Feeding: Performed;Moderate assistance Eating/Feeding Details (indicate cue type and reason): Pt. provided with tubing to allow  for grasping of utensils due to pt. with increased swelling of digits and hand resulting in discomfort with grasp.  Where Assessed - Eating/Feeding: Chair Grooming: Performed;Wash/dry face;Minimal assistance Grooming Details (indicate cue type and reason): Min assist provided under left. elbow to facilitate hand to face due to discomfort from IV infiltrate in left UE. Pt. reports full AROM of left UE Where Assessed - Grooming: Sitting, bed Upper Body Bathing: Simulated;Chest;Right arm;Left arm;Abdomen;Maximal assistance Where Assessed - Upper Body Bathing: Sitting, bed Lower Body Bathing: Simulated;+1 Total assistance Where Assessed - Lower Body Bathing: Sit to stand from bed Upper Body Dressing: Performed;Maximal assistance Upper Body Dressing Details (indicate cue type and reason): with donning gown Where Assessed - Upper Body Dressing: Sitting, bed Lower Body Dressing: Simulated;+1 Total assistance Where Assessed - Lower Body Dressing: Sit to stand from bed Toilet Transfer: Simulated;+2 Total assistance;Comment for patient % (pt=75%) Toilet Transfer Details (indicate cue type and reason): Min verbal cues for safety and transfer technique Toilet Transfer Method: Proofreader: Other (comment) (straight back chair) Toileting - Clothing Manipulation: Simulated;Maximal assistance Where Assessed - Glass blower/designer Manipulation: Standing Toileting - Hygiene: Simulated;+1 Total assistance Toileting - Hygiene Details (indicate cue type and reason): Pt unable to reach backside Where Assessed - Toileting Hygiene: Standing Tub/Shower Transfer: Not assessed Tub/Shower Transfer Method: Not assessed Ambulation Related to ADLs: NA ADL  Comments: Pt. educated on cervical precautions with ADLs and use of cervical brace. Pt. with increased bil UE pain and educated on positioning with pillows to decrease bil hand swelling . Educated pt. on completing hand exercises and elbow flexion/extension 8-10 reps 3x a day to increase fine motor control and decrease swelling.  Vision/Perception  Vision - History Baseline Vision: Wears glasses all the time Patient Visual Report: No change from baseline Vision - Assessment Eye Alignment: Within Functional Limits Vision Assessment: Vision not tested Cognition Cognition Arousal/Alertness: Awake/alert Overall Cognitive Status: Appears within functional limits for tasks assessed Orientation Level: Oriented X4 Sensation/Coordination Sensation Light Touch: Appears Intact Additional Comments: Pt. reports tingling in rt. hand however probable due to increased swelling Extremity Assessment RUE Assessment RUE Assessment: Exceptions to [facility] RUE AROM (degrees) Overall AROM Right Upper Extremity: Due to pain;Due to premorbid status;Due to precautions Right Elbow Flexion/Extension WFL Right Forearm Pronation  WFL Right Forearm Supination  WFL Right Wrist Extension WFL Right Wrist Flexion 0-80:WFL Right Composite Finger Extension: 50% Right Composite Finger Flexion: 50% Right Thumb Opposition: Digit 5 with increased time LUE Assessment LUE Assessment: Exceptions to WFL LUE AROM (degrees) Overall AROM Left Upper Extremity: Due to pain (IV infiltrated. Pt. reports AROM usually WFL) Mobility  Bed Mobility Bed Mobility: Yes Supine to Sit: 1: +2 Total assist;Patient percentage (comment);HOB elevated (Comment degrees) (pt=40%) Supine to Sit Details (indicate cue type and reason): Assist to elevate trunk off bed and for bil LE management Transfers Transfers: Yes Sit to Stand: 1: +2 Total assist;Patient percentage (comment);From bed;With upper extremity assist;From elevated surface (pt=75%) Sit to  Stand Details (indicate cue type and reason): mod verbal cues for technique and hand placement    End of Session OT - End of Session Equipment Utilized During Treatment: Other (comment);Gait belt;Cervical collar (built up utensils) Activity Tolerance: Patient tolerated treatment well Patient left: in chair;with call bell in reach Nurse Communication: Mobility status for transfers General Behavior During Session: [facility] for tasks performed Cognition: [facility] for tasks performed  Co-evaluation with Flora Lipps, OTR/L Pager [phone] 02/24/2011, 12:43 PM

## 2011-02-24 NOTE — Progress Notes (Signed)
Physical Therapy Evaluation Patient Details Name: [name] MRN: [id number] DOB: Jul 14, 1940 Today's Date: 02/24/2011  Problem List:  Patient Active Problem List  Diagnoses  . DM  . GERD  . DIVERTICULOSIS-COLON  . DYSPHAGIA UNSPECIFIED  . ABDOMINAL PAIN, EPIGASTRIC  . Cervical spondylosis with myelopathy    Past Medical History:  Past Medical History  Diagnosis Date  . Angina   . Hypertension   . Abnormal cholesterol test     elevated  . Hypothyroidism   . Arthritis   . GERD (gastroesophageal reflux disease)   . H/O hiatal hernia   . Diabetes mellitus   . Glaucoma   . Urine blood     hx of-no diagnosis as to why  . Kidney lesion   . Anxiety   . Depression   . Headache   . Blood transfusion     with hysterectomy mid 1980s  . Pneumonia     walking pneumonia twice -10 yrs ago  . Anemia    Past Surgical History:  Past Surgical History  Procedure Date  . Appendectomy   . Abdominal hysterectomy   . Thyroidectomy   . Cholecystectomy   . Kidney surgery     lesion removed     PT Assessment/Plan/Recommendation PT Assessment Clinical Impression Statement: Pt presents with a medical diagnosis of C4-5 corpectomy along with the following impairments/deficits and therapy diagnosis listed below. Pt will benefit from skilled PT in the acute care setting in order to maximize functional mobility for a safe d/c home (Pt planning on going in for another neck surgery on Wed.) PT Recommendation/Assessment: Patient will need skilled PT in the acute care venue PT Problem List: Decreased strength;Decreased range of motion;Decreased activity tolerance;Decreased mobility;Decreased knowledge of use of DME;Decreased knowledge of precautions;Pain PT Therapy Diagnosis : Acute pain;Difficulty walking PT Plan PT Frequency: Min 5X/week PT Treatment/Interventions: DME instruction;Gait training;Stair training;Functional mobility training;Therapeutic activities;Therapeutic  exercise;Patient/family education PT Recommendation Recommendations for Other Services: Rehab consult Follow Up Recommendations: Inpatient Rehab Equipment Recommended: Defer to next venue PT Goals  Acute Rehab PT Goals PT Goal Formulation: With patient Time For Goal Achievement: 2 weeks Pt will go Supine/Side to Sit: with modified independence PT Goal: Supine/Side to Sit - Progress: Goal set today Pt will go Sit to Supine/Side: with modified independence PT Goal: Sit to Supine/Side - Progress: Goal set today Pt will go Sit to Stand: with supervision PT Goal: Sit to Stand - Progress: Goal set today Pt will go Stand to Sit: with supervision PT Goal: Stand to Sit - Progress: Goal set today Pt will Transfer Bed to Chair/Chair to Bed: with min assist PT Transfer Goal: Bed to Chair/Chair to Bed - Progress: Goal set today Pt will Ambulate: 51 - 150 feet;with supervision;with least restrictive assistive device PT Goal: Ambulate - Progress: Goal set today Pt will Perform Home Exercise Program: Independently PT Goal: Perform Home Exercise Program - Progress: Goal set today  PT Evaluation Precautions/Restrictions  Precautions Precautions: Other (comment) (cervical) Required Braces or Orthoses: Yes Cervical Brace: Hard collar;Applied in supine position Restrictions Weight Bearing Restrictions: No Prior Functioning  Home Living Lives With: Other (Comment) (grandson) Receives Help From: Family (intermittent assistance) Type of Home: House Home Layout: One level Home Access: Stairs to enter Entrance Stairs-Rails: None Entrance Stairs-Number of Steps: 3 Bathroom Shower/Tub: Forensic scientist: Standard Bathroom Accessibility: Yes How Accessible: Accessible via walker Home Adaptive Equipment: None Prior Function Level of Independence: Independent with basic ADLs;Independent with gait;Independent with transfers;Needs assistance with  homemaking Vacuuming:  Maximal Light Housekeeping: Moderate Able to Take Stairs?: Yes Driving: Yes Vocation: Part time employment Vocation Requirements: [employer] Cognition Arousal/Alertness: Awake/alert Overall Cognitive Status: Appears within functional limits for tasks assessed Orientation Level: Oriented X4 Sensation/Coordination Sensation Light Touch: Appears Intact Additional Comments: Pt. reports tingling in rt. hand however probable due to increased swelling Extremity Assessment RUE Assessment RUE Assessment: Exceptions to [facility] RUE AROM (degrees) Overall AROM Right Upper Extremity: Due to pain;Due to premorbid status;Due to precautions Right Elbow Flexion/Extension 0-135-150: 135  Right Forearm Pronation  0-80-90: 80 Degrees Right Forearm Supination  0-80-90: 80 Degrees Right Wrist Extension 0-70: 50 Degrees Right Wrist Flexion 0-80: 70 Degrees Right Composite Finger Extension: 50% Right Composite Finger Flexion: 50% Right Thumb Opposition: Digit 5 LUE Assessment LUE Assessment: Exceptions to WFL LUE AROM (degrees) Overall AROM Left Upper Extremity: Due to pain (IV infiltrated. Pt. reports AROM usually WFL) RLE Assessment RLE Assessment: Within Functional Limits LLE Assessment LLE Assessment: Within Functional Limits Mobility (including Balance) Bed Mobility Bed Mobility: Yes Supine to Sit: 1: +2 Total assist;Patient percentage (comment);HOB elevated (Comment degrees) (pt=40%) Supine to Sit Details (indicate cue type and reason): Assist to elevate trunk off bed and for bil LE management Transfers Transfers: Yes Sit to Stand: 1: +2 Total assist;Patient percentage (comment);From bed;With upper extremity assist;From elevated surface (pt=75%) Sit to Stand Details (indicate cue type and reason): mod verbal cues for technique and hand placement (VC for anterior translation to assist with standing) Stand to Sit: 3: Mod assist;With upper extremity assist;To  chair/3-in-1 Stand to Sit Details: VC for hand placement into sitting and for anterior translation Stand Pivot Transfers: 1: +2 Total assist;Patient percentage (comment) (70%) Stand Pivot Transfer Details (indicate cue type and reason): VC throughout for sequencing. Pt with right knee buckling during mini steps. Assist for stability. Ambulation/Gait Ambulation/Gait: No Stairs: No    Exercise    End of Session PT - End of Session Equipment Utilized During Treatment: Gait belt;Cervical collar Activity Tolerance: Patient tolerated treatment well Patient left: in chair;with call bell in reach Nurse Communication: Mobility status for transfers;Mobility status for ambulation General Behavior During Session: [facility] for tasks performed Cognition: [facility] for tasks performed  Milana Kidney 02/24/2011, 12:57 PM  02/24/2011 Milana Kidney DPT PAGER: [phone] OFFICE: [phone]

## 2011-02-25 MED ORDER — WHITE PETROLATUM GEL
Status: AC
  Administered 2011-02-25: 11:00:00
  Filled 2011-02-25: qty 5

## 2011-02-25 MED ORDER — METFORMIN HCL ER 750 MG PO TB24
1500.0000 mg | ORAL_TABLET | Freq: Every day | ORAL | Status: DC
  Administered 2011-02-25 – 2011-03-05 (×9): 1500 mg via ORAL
  Filled 2011-02-25 (×10): qty 2

## 2011-02-25 MED ORDER — METFORMIN HCL 500 MG PO TABS: 1500.0000 mg | ORAL_TABLET | Freq: Every morning | ORAL | Status: DC

## 2011-02-25 NOTE — Progress Notes (Signed)
Subjective: Patient reports Patient reports she is doing much better this morning improved movement of the right upper extremities and no worsening of the skin sloughing on the left upper cavity the  Objective: Vital signs in last 24 hours: Temp:  [98.1 F (36.7 C)-98.9 F (37.2 C)] 98.1 F (36.7 C) (02/16 2352) Pulse Rate:  [57-77] 65  (02/17 0846) Resp:  [6-17] 10  (02/17 0800) BP: (124-148)/(62-81) 136/74 mmHg (02/17 0846) SpO2:  [90 %-97 %] 96 % (02/17 0800) Weight:  [89.3 kg (196 lb 13.9 oz)] 89.3 kg (196 lb 13.9 oz) (02/17 0600)  Intake/Output from previous day: 02/16 0701 - 02/17 0700 In: 3900 [P.O.:1020; I.V.:2880] Out: 3425 [Urine:3425] Intake/Output this shift: Total I/O In: 80 [I.V.:80] Out: 770 [Urine:770]  neurologically a baseline still some deltoid weakness in the right left extremity looks good blistering is less  Lab Results: No results found for this basename: WBC:2,HGB:2,HCT:2,PLT:2 in the last 72 hours BMET No results found for this basename: NA:2,K:2,CL:2,CO2:2,GLUCOSE:2,BUN:2,CREATININE:2,CALCIUM:2 in the last 72 hours  Studies/Results: Dg Cervical Spine Complete  02/23/2011  *RADIOLOGY REPORT*  Clinical Data: Anterior cervical corpectomies C3-C6.  CERVICAL SPINE - COMPLETE 4+ VIEW  Comparison: None.  Findings: Lateral cervical spine intraoperative localizer a radiographs are submitted for interpretation.  The third images demonstrates localization needle in the C4-C5 interspace. Subsequent images demonstrate C3 through C6 plate with soft tissue retractors overlying the site of C4 and C5 probable corpectomy. Radiopaque wires are present in the anterior neck compatible with enteric tube and endotracheal tube.  IMPRESSION: Intraoperative localization as above.  Original Report Authenticated By: Andreas Newport, M.D.    Assessment/Plan: Postop day 2 from ACD[facility] will transfer her to the floor awaiting a wound care consult. Will transfer her to the floor.  LOS: 2  days     [name] P 02/25/2011, 9:46 AM

## 2011-02-25 NOTE — Progress Notes (Signed)
Physical Therapy Treatment Patient Details Name: [name] MRN: [id number] DOB: 07/09/1940 Today's Date: 02/25/2011  PT Assessment/Plan  PT - Assessment/Plan Comments on Treatment Session: Pt progressing well this session. Ambulation distance still limited by UE and neck pain. Will attempt RW pending UE decreased pain. Continue per plan PT Plan: Discharge plan remains appropriate;Frequency remains appropriate PT Frequency: Min 5X/week Recommendations for Other Services: Rehab consult Follow Up Recommendations: Inpatient Rehab Equipment Recommended: Defer to next venue PT Goals  Acute Rehab PT Goals PT Goal Formulation: With patient PT Goal: Sit to Stand - Progress: Progressing toward goal PT Goal: Stand to Sit - Progress: Progressing toward goal PT Transfer Goal: Bed to Chair/Chair to Bed - Progress: Progressing toward goal PT Goal: Ambulate - Progress: Progressing toward goal  PT Treatment Precautions/Restrictions  Precautions Precautions: Other (comment) (cervical) Required Braces or Orthoses: Yes Cervical Brace: Hard collar;Applied in supine position Restrictions Weight Bearing Restrictions: No Mobility (including Balance) Bed Mobility Bed Mobility: No Transfers Transfers: Yes Sit to Stand: 3: Mod assist;With upper extremity assist;From chair/3-in-1 Sit to Stand Details (indicate cue type and reason): VC for hand placement and sequencing. Mod assist for stability into standing Stand to Sit: 4: Min assist;With upper extremity assist;To chair/3-in-1 Stand to Sit Details: VC for hand placement. Pt able to control descent into chair with minimal physical assist Ambulation/Gait Ambulation/Gait: Yes Ambulation/Gait Assistance: 3: Mod assist Ambulation/Gait Assistance Details (indicate cue type and reason): Mod assist for stability. One minor loss of balance in which pt was able to correct self. Majority of ambulation completed with 2 hand held assist as pt has pain through  both UEs and is unable to bear weight through RW. Ambulation Distance (Feet): 50 Feet Assistive device: 1 person hand held assist;2 person hand held assist Gait Pattern: Step-to pattern;Decreased stride length;Decreased hip/knee flexion - right;Decreased trunk rotation Gait velocity: Decreased gait speed    Exercise    End of Session PT - End of Session Equipment Utilized During Treatment: Gait belt;Cervical collar Activity Tolerance: Patient tolerated treatment well Patient left: in chair;with call bell in reach Nurse Communication: Mobility status for transfers;Mobility status for ambulation General Behavior During Session: [facility] for tasks performed Cognition: [facility] for tasks performed  Milana Kidney 02/25/2011, 1:10 PM  02/25/2011 Milana Kidney DPT PAGER: [phone] OFFICE: [phone]

## 2011-02-26 ENCOUNTER — Encounter (HOSPITAL_COMMUNITY): Payer: Self-pay | Admitting: Neurosurgery

## 2011-02-26 NOTE — Progress Notes (Signed)
Physical Therapy Treatment Patient Details Name: TASHINA CREDIT MRN: [id number] DOB: [birth year] Today's Date: 02/26/2011  PT Assessment/Plan  PT - Assessment/Plan Comments on Treatment Session: Pt progressing well this session. Ambulation distance limted by fatigue. Pt with much improved stability with RW, agrees to utilize RW at this time. Pt would continue to benefit from CIR stay PT Plan: Discharge plan remains appropriate;Frequency remains appropriate PT Frequency: Min 5X/week Recommendations for Other Services: Rehab consult Follow Up Recommendations: Inpatient Rehab Equipment Recommended: Defer to next venue PT Goals  Acute Rehab PT Goals Pt will go Sit to Stand: with supervision PT Goal: Sit to Stand - Progress: Progressing toward goal Pt will go Stand to Sit: with supervision PT Goal: Stand to Sit - Progress: Progressing toward goal Pt will Transfer Bed to Chair/Chair to Bed: with min assist PT Transfer Goal: Bed to Chair/Chair to Bed - Progress: Progressing toward goal Pt will Ambulate: 51 - 150 feet;with supervision;with least restrictive assistive device PT Goal: Ambulate - Progress: Progressing toward goal Pt will Perform Home Exercise Program: Independently PT Goal: Perform Home Exercise Program - Progress: Progressing toward goal  PT Treatment Precautions/Restrictions  Precautions Precautions: Other (comment) (cervical) Required Braces or Orthoses: Yes Cervical Brace: Hard collar;Applied in supine position Restrictions Weight Bearing Restrictions: No Mobility (including Balance) Bed Mobility Bed Mobility: No (seated at start) Transfers Transfers: Yes Sit to Stand: 3: Mod assist;With upper extremity assist;From chair/3-in-1 Sit to Stand Details (indicate cue type and reason): Assist secondary to weakness and decreased stability Stand to Sit: With upper extremity assist;To chair/3-in-1;3: Mod assist Stand to Sit Details: VC for hand placement. Assist to help  control descent. PT to place Rt. hand on armrest as pt had difficulty placing hand.   Ambulation/Gait Ambulation/Gait: Yes Ambulation/Gait Assistance: 3: Mod assist;4: Min assist Ambulation/Gait Assistance Details (indicate cue type and reason): Moderate assist for stability without assistive device, min assist with RW. Without device pt reaching out for stability, had very large base of support. With and without device pt with VERY slow gait, unable to take increased step length with cues.  Ambulation Distance (Feet): 150 Feet Assistive device: 1 person hand held assist;2 person hand held assist;Rolling walker Gait Pattern: Step-to pattern;Decreased stride length;Decreased hip/knee flexion - right;Decreased trunk rotation Gait velocity: pt walks with speed of approximately 0.133 ft/sec placeing her at increase risk for falls.  Stairs: No    Exercise   Discussed need for further strengthening with next visits. End of Session PT - End of Session Equipment Utilized During Treatment: Gait belt;Cervical collar Activity Tolerance: Patient tolerated treatment well Patient left: in chair;with call bell in reach General Behavior During Session: [facility] for tasks performed Cognition: [facility] for tasks performed  Sherie Don 02/26/2011, 4:34 PM  Sherie Don) Carleene Mains PT, DPT Acute Rehabilitation [phone]

## 2011-02-26 NOTE — Progress Notes (Addendum)
Subjective: Patient reports feeling better. Much more comfortable.   Objective: Vital signs in last 24 hours: Temp:  [97.8 F (36.6 C)-100 F (37.8 C)] 99 F (37.2 C) (02/18 1807) Pulse Rate:  [65-78] 72  (02/18 1807) Resp:  [16-18] 18  (02/18 1807) BP: (145-183)/(80-96) 146/86 mmHg (02/18 1807) SpO2:  [95 %-96 %] 95 % (02/18 1807)  Intake/Output from previous day: 02/17 0701 - 02/18 0700 In: 2040 [P.O.:360; I.V.:1680] Out: 3170 [Urine:3170] Intake/Output this shift:    weakness in right shoulder, present at baseline. Wound is clean and dry no signs of infection. Stage ll for wednesday.  Lab Results: No results found for this basename: WBC:2,HGB:2,HCT:2,PLT:2 in the last 72 hours BMET No results found for this basename: NA:2,K:2,CL:2,CO2:2,GLUCOSE:2,BUN:2,CREATININE:2,CALCIUM:2 in the last 72 hours  Studies/Results: No results found.  Assessment/Plan: Or Wednesday for posterior fusion. Arm is improving. Wound care has been notified. Awaiting consult.   LOS: 3 days     Ioan Landini L 02/26/2011, 6:54 PM

## 2011-02-27 NOTE — Consult Note (Signed)
WOC consult Note Reason for Consult: Consult requested for left arm wounds.  Pt states "they think it was probably an IV that leaked on the day of surgery."  Unknown medication for unknown infiltrate. Wound type: Full thickness Measurement: Left anterior arm with several patchy areas of blisters which have ruptured, some fluid filled areas remain.  Beefy red wounds have evolved 3X2X.1cm, 7X6X.1cm, and anterior arm near elbow 2X1X.1cm. Areas painful, small yellow drainage, no odor. Dressing procedure/placement/frequency: Foam dressing to protect and promote healing. Will not plan to follow further unless re-consulted.  [address], RN, MSN, [employer]  [phone]

## 2011-02-27 NOTE — Progress Notes (Signed)
Physical Therapy Treatment Patient Details Name: [name] MRN: [id number] DOB: 1940-01-18 Today's Date: 02/27/2011  PT Assessment/Plan  PT - Assessment/Plan Comments on Treatment Session: Pt progressing ambulation distance today however continues with very slow gait. Pt with difficulty negotiating tight spaces in room. Pt also has difficulty keeping proper neck alignment with functional activity - will continue to work with her on her cervical precautions.  PT Plan: Discharge plan remains appropriate;Frequency remains appropriate PT Frequency: Min 5X/week Recommendations for Other Services: Rehab consult Follow Up Recommendations: Inpatient Rehab;Skilled nursing facility;Supervision/Assistance - 24 hour Equipment Recommended: Defer to next venue PT Goals  Acute Rehab PT Goals Pt will go Sit to Stand: with supervision PT Goal: Sit to Stand - Progress: Progressing toward goal Pt will go Stand to Sit: with supervision PT Goal: Stand to Sit - Progress: Progressing toward goal Pt will Transfer Bed to Chair/Chair to Bed: with min assist PT Transfer Goal: Bed to Chair/Chair to Bed - Progress: Progressing toward goal Pt will Ambulate: 51 - 150 feet;with supervision;with least restrictive assistive device PT Goal: Ambulate - Progress: Progressing toward goal Pt will Perform Home Exercise Program: Independently PT Goal: Perform Home Exercise Program - Progress: Progressing toward goal  PT Treatment Precautions/Restrictions  Precautions Precautions: Other (comment) (cervical) Required Braces or Orthoses: Yes Cervical Brace: Hard collar;Applied in supine position Restrictions Weight Bearing Restrictions: No Mobility (including Balance) Bed Mobility Bed Mobility: No (seated at start) Transfers Transfers: Yes Sit to Stand: With upper extremity assist;From chair/3-in-1;4: Min assist Sit to Stand Details (indicate cue type and reason): Much improvement today with physical strength. Max  verbal cues for UE placement.  Stand to Sit: With upper extremity assist;To chair/3-in-1;3: Mod assist Stand to Sit Details: Assist to control descent. Verbal cues/tactile cues for squaring up to chair prior to sitting. Verbal cues for hand placement.  Ambulation/Gait Ambulation/Gait: Yes Ambulation/Gait Assistance: 4: Min assist;3: Mod assist Ambulation/Gait Assistance Details (indicate cue type and reason): moderate assist to sidestep through narrow space in room, pt with difficulty problem solving through task. Performed 2x - PT to direct RW at times. With mild improvements with gait speed however slowed with fatigue. Verbal cues for increased step length and cadence, improved heel strike/forefoot clearance.  Ambulation Distance (Feet): 200 Feet Assistive device: Rolling walker Gait Pattern: Decreased stride length;Decreased hip/knee flexion - right;Decreased trunk rotation;Step-through pattern Stairs: No    Exercise  General Exercises - Lower Extremity Ankle Circles/Pumps: AROM;Both;10 reps;Seated Long Arc Quad: AROM;Both;10 reps;Seated Hip Flexion/Marching: AROM;Both;10 reps;Seated Other Exercises Other Exercises: Pt encouraged to perform LE exercises after supper and throughout day.  End of Session PT - End of Session Equipment Utilized During Treatment: Gait belt;Cervical collar Activity Tolerance: Patient tolerated treatment well;Patient limited by fatigue Patient left: in chair;with call bell in reach;with family/visitor present Nurse Communication: Mobility status for transfers (request for pain medication) General Behavior During Session: [facility] for tasks performed Cognition: [facility] for tasks performed (min decreased problem solving )  Sherie Don 02/27/2011, 5:43 PM Sherie Don) Carleene Mains PT, DPT Acute Rehabilitation [phone]

## 2011-02-27 NOTE — Progress Notes (Signed)
Clinical Social Worker received consult for "SNF." Currently, PT is recommending CIR. CSW met with pt to address consult. CSW introduced herself and explained role of social work. Pt lives with her 69 year old grandson and has a supportive sister who lives 10 miles away. Pt appeared to be interested in CIR at this time. Pt shared that she would prefer [facility] in [place] if she would have to go to a SNF. CSW will continue to follow and assess discharge plan. CSW will initiate SNF search when appropriate.  Dede Query, MSW, Theresia Majors [phone]

## 2011-02-27 NOTE — Progress Notes (Signed)
PT Cancellation Note  Pt receiving bath currently, with check back as time allows. Thanks!  Deshon Hsiao (Beverely Pace) Carleene Mains PT, DPT Acute Rehabilitation [phone]

## 2011-02-27 NOTE — Progress Notes (Signed)
BP 136/91  Pulse 96  Temp(Src) 98.4 F (36.9 C) (Oral)  Resp 18  Ht 5\' 5"  (1.651 m)  Wt 89.3 kg (196 lb 13.9 oz)  BMI 32.76 kg/m2  SpO2 96% Alert and oriented x4. Speech clear and fluent. Voice strong. Moving  Upper extremities well. Limited strength and movement in the right  Shoulder. Or tomorrow for posterior decompression and fusion C3-6. Risks and benefits including but not limited to bleeding, infection, fusion failure, hardware failure, paralysis, weakness, bowel and bladder dysfunction, were discussed. She understands and wishes to proceed.

## 2011-02-28 ENCOUNTER — Inpatient Hospital Stay (HOSPITAL_COMMUNITY): Payer: Medicare Other | Admitting: Anesthesiology

## 2011-02-28 ENCOUNTER — Encounter (HOSPITAL_COMMUNITY): Payer: Self-pay | Admitting: Anesthesiology

## 2011-02-28 ENCOUNTER — Inpatient Hospital Stay (HOSPITAL_COMMUNITY): Payer: Medicare Other

## 2011-02-28 ENCOUNTER — Encounter (HOSPITAL_COMMUNITY)
Admission: RE | Disposition: A | Discharge status: Skilled Nursing Facility | Payer: Self-pay | Source: Ambulatory Visit | Attending: Neurosurgery

## 2011-02-28 ENCOUNTER — Inpatient Hospital Stay (HOSPITAL_COMMUNITY): Admission: RE | Admit: 2011-02-28 | Payer: Medicare Other | Source: Ambulatory Visit | Admitting: Neurosurgery

## 2011-02-28 HISTORY — PX: POSTERIOR CERVICAL FUSION/FORAMINOTOMY: SHX5038

## 2011-02-28 LAB — GLUCOSE, CAPILLARY

## 2011-02-28 LAB — POCT I-STAT 4, (NA,K, GLUC, HGB,HCT)
Glucose, Bld: 93 mg/dL (ref 70–99)
Hemoglobin: 9.2 g/dL — ABNORMAL LOW (ref 12.0–15.0)
Potassium: 3.8 mEq/L (ref 3.5–5.1)
Sodium: 139 mEq/L (ref 135–145)

## 2011-02-28 SURGERY — POSTERIOR CERVICAL FUSION/FORAMINOTOMY LEVEL 3
Anesthesia: General

## 2011-02-28 MED ORDER — HYDROMORPHONE HCL PF 1 MG/ML IJ SOLN
0.2500 mg | INTRAMUSCULAR | Status: DC | PRN
  Administered 2011-02-28: 0.5 mg via INTRAVENOUS

## 2011-02-28 MED ORDER — HYDROMORPHONE HCL PF 1 MG/ML IJ SOLN
INTRAMUSCULAR | Status: AC
  Administered 2011-02-28: 0.5 mg
  Filled 2011-02-28: qty 1

## 2011-02-28 MED ORDER — LACTATED RINGERS IV SOLN
INTRAVENOUS | Status: DC | PRN
  Administered 2011-02-28 (×2): via INTRAVENOUS

## 2011-02-28 MED ORDER — DEXAMETHASONE SODIUM PHOSPHATE 10 MG/ML IJ SOLN
6.0000 mg | Freq: Once | INTRAMUSCULAR | Status: AC
  Administered 2011-02-28: 6 mg via INTRAVENOUS

## 2011-02-28 MED ORDER — NEOSTIGMINE METHYLSULFATE 1 MG/ML IJ SOLN
INTRAMUSCULAR | Status: DC | PRN
  Administered 2011-02-28: 5 mg via INTRAVENOUS

## 2011-02-28 MED ORDER — GLYCOPYRROLATE 0.2 MG/ML IJ SOLN
INTRAMUSCULAR | Status: DC | PRN
  Administered 2011-02-28: .8 mg via INTRAVENOUS

## 2011-02-28 MED ORDER — PROPOFOL 10 MG/ML IV EMUL
INTRAVENOUS | Status: DC | PRN
  Administered 2011-02-28: 50 mg via INTRAVENOUS
  Administered 2011-02-28: 150 mg via INTRAVENOUS

## 2011-02-28 MED ORDER — HEMOSTATIC AGENTS (NO CHARGE) OPTIME
TOPICAL | Status: DC | PRN
  Administered 2011-02-28: 1 via TOPICAL

## 2011-02-28 MED ORDER — LIDOCAINE-EPINEPHRINE 0.5-1:200000 % IJ SOLN
INTRAMUSCULAR | Status: DC | PRN
  Administered 2011-02-28: 10 mL

## 2011-02-28 MED ORDER — THROMBIN 20000 UNITS EX KIT
PACK | CUTANEOUS | Status: DC | PRN
  Administered 2011-02-28: 20000 [IU] via TOPICAL

## 2011-02-28 MED ORDER — BACITRACIN ZINC 500 UNIT/GM EX OINT
TOPICAL_OINTMENT | CUTANEOUS | Status: DC | PRN
  Administered 2011-02-28: 1 via TOPICAL

## 2011-02-28 MED ORDER — VANCOMYCIN HCL IN DEXTROSE 1-5 GM/200ML-% IV SOLN
INTRAVENOUS | Status: AC
  Administered 2011-02-28: 1000 mg via INTRAVENOUS
  Filled 2011-02-28: qty 200

## 2011-02-28 MED ORDER — DEXAMETHASONE SODIUM PHOSPHATE 10 MG/ML IJ SOLN
INTRAMUSCULAR | Status: AC
  Filled 2011-02-28: qty 1

## 2011-02-28 MED ORDER — ROCURONIUM BROMIDE 100 MG/10ML IV SOLN
INTRAVENOUS | Status: DC | PRN
  Administered 2011-02-28 (×2): 10 mg via INTRAVENOUS
  Administered 2011-02-28: 40 mg via INTRAVENOUS
  Administered 2011-02-28: 5 mg via INTRAVENOUS
  Administered 2011-02-28: 20 mg via INTRAVENOUS
  Administered 2011-02-28: 10 mg via INTRAVENOUS

## 2011-02-28 MED ORDER — MIDAZOLAM HCL 5 MG/5ML IJ SOLN
INTRAMUSCULAR | Status: DC | PRN
  Administered 2011-02-28: 1 mg via INTRAVENOUS

## 2011-02-28 MED ORDER — ONDANSETRON HCL 4 MG/2ML IJ SOLN: 4.0000 mg | Freq: Once | INTRAMUSCULAR | Status: DC | PRN

## 2011-02-28 MED ORDER — FENTANYL CITRATE 0.05 MG/ML IJ SOLN
INTRAMUSCULAR | Status: DC | PRN
  Administered 2011-02-28: 100 ug via INTRAVENOUS

## 2011-02-28 MED ORDER — PHENYLEPHRINE HCL 10 MG/ML IJ SOLN
10.0000 mg | INTRAVENOUS | Status: DC | PRN
  Administered 2011-02-28: 10 ug/min via INTRAVENOUS

## 2011-02-28 MED ORDER — LACTATED RINGERS IV SOLN
INTRAVENOUS | Status: DC | PRN
  Administered 2011-02-28 (×2): via INTRAVENOUS

## 2011-02-28 MED ORDER — POTASSIUM CHLORIDE IN NACL 20-0.9 MEQ/L-% IV SOLN
INTRAVENOUS | Status: DC
  Administered 2011-02-28: 19:00:00 via INTRAVENOUS
  Filled 2011-02-28 (×3): qty 1000

## 2011-02-28 MED ORDER — 0.9 % SODIUM CHLORIDE (POUR BTL) OPTIME
TOPICAL | Status: DC | PRN
  Administered 2011-02-28: 1000 mL

## 2011-02-28 MED ORDER — ONDANSETRON HCL 4 MG/2ML IJ SOLN
INTRAMUSCULAR | Status: DC | PRN
  Administered 2011-02-28: 4 mg via INTRAVENOUS

## 2011-02-28 SURGICAL SUPPLY — 64 items
APL SKNCLS STERI-STRIP NONHPOA (GAUZE/BANDAGES/DRESSINGS) ×4
BAG DECANTER FOR FLEXI CONT (MISCELLANEOUS) ×2 IMPLANT
BENZOIN TINCTURE PRP APPL 2/3 (GAUZE/BANDAGES/DRESSINGS) ×6 IMPLANT
BIT DRILL 2.4X (BIT) IMPLANT
BIT DRL 2.4X (BIT) ×1
BLADE SURG ROTATE 9660 (MISCELLANEOUS) ×2 IMPLANT
BLADE ULTRA TIP 2M (BLADE) IMPLANT
BUR MATCHSTICK NEURO 3.0 LAGG (BURR) ×2 IMPLANT
CANISTER SUCTION 2500CC (MISCELLANEOUS) ×2 IMPLANT
CLOTH BEACON ORANGE TIMEOUT ST (SAFETY) ×2 IMPLANT
CONT SPEC 4OZ CLIKSEAL STRL BL (MISCELLANEOUS) ×2 IMPLANT
DECANTER SPIKE VIAL GLASS SM (MISCELLANEOUS) ×2 IMPLANT
DRAPE C-ARM 42X72 X-RAY (DRAPES) ×4 IMPLANT
DRAPE LAPAROTOMY 100X72 PEDS (DRAPES) ×2 IMPLANT
DRAPE POUCH INSTRU U-SHP 10X18 (DRAPES) ×2 IMPLANT
DRESSING TELFA 8X3 (GAUZE/BANDAGES/DRESSINGS) ×1 IMPLANT
DRILL BIT (BIT) ×2
DRSG PAD ABDOMINAL 8X10 ST (GAUZE/BANDAGES/DRESSINGS) ×2 IMPLANT
DURAPREP 6ML APPLICATOR 50/CS (WOUND CARE) ×2 IMPLANT
ELECT REM PT RETURN 9FT ADLT (ELECTROSURGICAL) ×2
ELECTRODE REM PT RTRN 9FT ADLT (ELECTROSURGICAL) ×1 IMPLANT
GAUZE SPONGE 4X4 16PLY XRAY LF (GAUZE/BANDAGES/DRESSINGS) ×1 IMPLANT
GLOVE BIOGEL M 8.0 STRL (GLOVE) ×1 IMPLANT
GLOVE BIOGEL PI IND STRL 7.5 (GLOVE) IMPLANT
GLOVE BIOGEL PI INDICATOR 7.5 (GLOVE) ×1
GLOVE ECLIPSE 6.5 STRL STRAW (GLOVE) ×2 IMPLANT
GLOVE ECLIPSE 7.5 STRL STRAW (GLOVE) ×5 IMPLANT
GLOVE EXAM NITRILE LRG STRL (GLOVE) IMPLANT
GLOVE EXAM NITRILE MD LF STRL (GLOVE) ×1 IMPLANT
GLOVE EXAM NITRILE XL STR (GLOVE) IMPLANT
GLOVE EXAM NITRILE XS STR PU (GLOVE) IMPLANT
GLOVE INDICATOR 8.0 STRL GRN (GLOVE) ×1 IMPLANT
GOWN BRE IMP SLV AUR LG STRL (GOWN DISPOSABLE) IMPLANT
GOWN BRE IMP SLV AUR XL STRL (GOWN DISPOSABLE) ×3 IMPLANT
GOWN STRL REIN 2XL LVL4 (GOWN DISPOSABLE) ×2 IMPLANT
KIT BASIN OR (CUSTOM PROCEDURE TRAY) ×2 IMPLANT
KIT ROOM TURNOVER OR (KITS) ×2 IMPLANT
NDL HYPO 25X1 1.5 SAFETY (NEEDLE) ×1 IMPLANT
NEEDLE HYPO 25X1 1.5 SAFETY (NEEDLE) ×2 IMPLANT
NS IRRIG 1000ML POUR BTL (IV SOLUTION) ×2 IMPLANT
PACK LAMINECTOMY NEURO (CUSTOM PROCEDURE TRAY) ×2 IMPLANT
PAD ARMBOARD 7.5X6 YLW CONV (MISCELLANEOUS) ×6 IMPLANT
PIN MAYFIELD SKULL DISP (PIN) ×2 IMPLANT
ROD T 3.5MM (Rod) ×1 IMPLANT
SCREW 3.5X12 (Screw) ×16 IMPLANT
SCREW BN 12X3.5XMA SPNE (Screw) IMPLANT
SCREW SET M6 (Screw) ×8 IMPLANT
SPONGE GAUZE 4X4 12PLY (GAUZE/BANDAGES/DRESSINGS) IMPLANT
SPONGE GAUZE 4X4 STERILE 39 (GAUZE/BANDAGES/DRESSINGS) ×1 IMPLANT
SPONGE LAP 4X18 X RAY DECT (DISPOSABLE) IMPLANT
SPONGE SURGIFOAM ABS GEL 100 (HEMOSTASIS) ×2 IMPLANT
STAPLER SKIN PROX WIDE 3.9 (STAPLE) ×2 IMPLANT
STRIP CLOSURE SKIN 1/2X4 (GAUZE/BANDAGES/DRESSINGS) IMPLANT
SUT ETHILON 3 0 FSL (SUTURE) IMPLANT
SUT VIC AB 0 CT1 18XCR BRD8 (SUTURE) ×1 IMPLANT
SUT VIC AB 0 CT1 8-18 (SUTURE) ×4
SUT VIC AB 2-0 CT1 18 (SUTURE) ×2 IMPLANT
SUT VIC AB 3-0 SH 8-18 (SUTURE) ×2 IMPLANT
SYR 20ML ECCENTRIC (SYRINGE) ×2 IMPLANT
TAPE CLOTH SURG 4X10 WHT LF (GAUZE/BANDAGES/DRESSINGS) ×1 IMPLANT
TOWEL OR 17X24 6PK STRL BLUE (TOWEL DISPOSABLE) ×2 IMPLANT
TOWEL OR 17X26 10 PK STRL BLUE (TOWEL DISPOSABLE) ×2 IMPLANT
UNDERPAD 30X30 INCONTINENT (UNDERPADS AND DIAPERS) ×2 IMPLANT
WATER STERILE IRR 1000ML POUR (IV SOLUTION) ×2 IMPLANT

## 2011-02-28 NOTE — Anesthesia Preprocedure Evaluation (Addendum)
Anesthesia Evaluation  Patient identified by MRN, date of birth, ID band Patient awake    Reviewed: Allergy & Precautions, H&P , NPO status , Patient's Chart, lab work & pertinent test results  Airway Mallampati: III  Neck ROM: Limited  Mouth opening: Limited Mouth Opening  Dental  (+) Edentulous Upper and Partial Lower   Pulmonary  clear to auscultation        Cardiovascular hypertension, Regular Normal    Neuro/Psych Depression    GI/Hepatic hiatal hernia, GERD-  ,  Endo/Other  Diabetes mellitus-Hypothyroidism   Renal/GU      Musculoskeletal   Abdominal (+) obese,   Peds  Hematology   Anesthesia Other Findings   Reproductive/Obstetrics                          Anesthesia Physical Anesthesia Plan  ASA: III  Anesthesia Plan: General   Post-op Pain Management:    Induction: Intravenous  Airway Management Planned: Oral ETT  Additional Equipment:   Intra-op Plan:   Post-operative Plan: Extubation in OR  Informed Consent: I have reviewed the patients History and Physical, chart, labs and discussed the procedure including the risks, benefits and alternatives for the proposed anesthesia with the patient or authorized representative who has indicated his/her understanding and acceptance.   Dental advisory given  Plan Discussed with: CRNA, Surgeon and Anesthesiologist  Anesthesia Plan Comments: (Cervical Spondylosis with myelopathy Htn Type 2 DM  Plan Ga with glide scope.  Kipp Brood, MD)       Anesthesia Quick Evaluation

## 2011-02-28 NOTE — Transfer of Care (Signed)
Immediate Anesthesia Transfer of Care Note  Patient: [name]  Procedure(s) Performed: Procedure(s) (LRB): POSTERIOR CERVICAL FUSION/FORAMINOTOMY LEVEL 3 (N/A)  Patient Location: PACU  Anesthesia Type: General  Level of Consciousness: sedated  Airway & Oxygen Therapy: Patient Spontanous Breathing and Patient connected to face mask oxygen  Post-op Assessment: Report given to PACU RN and Post -op Vital signs reviewed and stable  Post vital signs: Reviewed and stable  Complications: No apparent anesthesia complications

## 2011-02-28 NOTE — Progress Notes (Signed)
Pt is not a CIR canidate, per CIR Admission Coordinators. CSW will persure SNF. CSW provided the list of SNFs in the area. CSW provided support to pt. CSW will continue to follow to facilitate discharge to SNF when medically ready.   Dede Query, MSW, Theresia Majors [phone]

## 2011-02-28 NOTE — Op Note (Signed)
02/23/2011 - 02/28/2011  1:24 PM  PATIENT:  [name]  71 y.o. female  PRE-OPERATIVE DIAGNOSIS:  cervical spondylosis with myelopathy cervical stenosis cervical cervical radiculopathy cervical three-four  POST-OPERATIVE DIAGNOSIS:  cervical spondylosis with myelopathy cervical stenosis cervical cervical radiculopathy cervical three-four  PROCEDURE:  Procedure(s): POSTERIOR CERVICAL FUSION/FORAMINOTOMY LEVEL 3, morsellized autograft Lateral Mass screws Med[employer]onic C3-6 bilaterally Cervical Laminectomy C3-6 for decompression   SURGEON:  Surgeon(s): Carmela Hurt, MD Karn Cassis, MD  ASSISTANTS:botero  ANESTHESIA:   general  EBL:  Total I/O In: 3000 [I.V.:3000] Out: 610 [Urine:460; Blood:150]  BLOOD ADMINISTERED:none  CELL SAVER GIVEN:none  COUNT:per nursing  DRAINS: none   SPECIMEN:  No Specimen  DICTATION: Mrs. Jeppsen was brought to the operating room intubated and placed under a general anesthetic. With her cervical collar in place she was rolled prone after a mayfield 3 pin head holder was placed, onto body rolls. A foley catheter was placed under sterile conditions prior to final positioning. I removed the collar after securing her head to the bed with the Mayfield adapter. Her head was in satisfactory position. I checked it with fluoroscopy. Her head was shaved and prepped along with the neck posteriorly. I infiltrated 10cc lidocaine/1:200000 strength epinephrine. I opened the skin with a number 10 blade, and dissected down with monopolar cautery to the cervical lamina and spinous processes. I checked my level with fluoro then started placement of the lateral mass screws. I reflected the musculature to expose the facets at each level of the fusion. I placed the screws with fluoroscopic guidance at C3,4,5, and C6 bilaterally. With Dr. Cassandria Santee assistance we completed the decompression of the spinal canal via laminectomies of C3,4,5,andC6. The canal was well  decompressed. I then completed the construct by placing rods across the screw heads and locking them into position.  I decorticated the facets and placed autograft to complete the arthrodesis. I then irrigated the wound. I closed the wound in layers with vicryl sutures, and the skin edges with staples. I placed a sterile dressing. She was then disconnected from the bed, rolled supine and extubated.  PLAN OF CARE: Admit to inpatient   PATIENT DISPOSITION:  PACU - hemodynamically stable.   Delay start of Pharmacological VTE agent (>24hrs) due to surgical blood loss or risk of bleeding:  yes

## 2011-02-28 NOTE — Preoperative (Signed)
Beta Blockers   Reason not to administer Beta Blockers: Coreg taken 02/27/11

## 2011-02-28 NOTE — Progress Notes (Signed)
Anesthesiology followup:  As noted the patient underwent a C4-5 corpectomy and anterior fusion of C3-6 on 02/23/2011. Following surgery it was noted that the IV in the left arm had infiltrated. The wound care specialist noted that there were several patchy areas of blisters which had ruptured and there was weeping of serosanguinous fluid from the wounds. Foam dressings were applied on on February 19.  The patient presently has the foam dressings covering the wounds and they were not removed by me. Her nurse has noted she is seeing no further weeping or drainage at this time.  I explained to [name] that the left arm wounds were the result of an infiltrated IV during surgery. I explained that due to the fact that her arms were tucked during the surgery, it is very difficult or impossible to tell if the IV is infiltrated or not. I explained that we will continue to followup ensure that the wounds are healing and see to  it that she gets proper treatment for this complication.  [name] said she understood and had no further questions at this time. The anesthesiology service will continue to followup while she remains in the hospital.  Kipp Brood, M.D.

## 2011-02-28 NOTE — Anesthesia Procedure Notes (Addendum)
Procedure Name: Intubation Date/Time: 02/28/2011 9:13 AM Performed by: Malachi Pro Pre-anesthesia Checklist: Patient identified, Emergency Drugs available, Suction available, Patient being monitored and Timeout performed Patient Re-evaluated:Patient Re-evaluated prior to inductionOxygen Delivery Method: Circle System Utilized Preoxygenation: Pre-oxygenation with 100% oxygen Intubation Type: Combination inhalational/ intravenous induction Ventilation: Mask ventilation without difficulty and Oral airway inserted - appropriate to patient size Laryngoscope Size: Mac and 3 Grade View: Grade II Tube type: Oral Airway Equipment and Method: video-laryngoscopy Placement Confirmation: ETT inserted through vocal cords under direct vision,  positive ETCO2 and breath sounds checked- equal and bilateral Secured at: 21 cm Tube secured with: Tape Dental Injury: Teeth and Oropharynx as per pre-operative assessment    Performed by: Malachi Pro Number of attempts: 1

## 2011-02-28 NOTE — Progress Notes (Signed)
PT Cancellation Note  Treatment cancelled today due to medical issues with patient which prohibited therapy. Pt currently in surgery for cervical spine. Will follow up on 03/01/11 as appropriate. MD - please clarify activity orders post-op for PT. Thanks!  [name] (Beverely Pace) Carleene Mains PT, DPT Acute Rehabilitation [phone]

## 2011-02-28 NOTE — Progress Notes (Signed)
OT cancelled today due to pt. In surgery.  Will continue/resume pending MD orders.  Jeani Hawking, OTR/L [phone]

## 2011-02-28 NOTE — Anesthesia Postprocedure Evaluation (Signed)
  Anesthesia Post-op Note  Patient: [name]  Procedure(s) Performed: Procedure(s) (LRB): POSTERIOR CERVICAL FUSION/FORAMINOTOMY LEVEL 3 (N/A)  Patient Location: PACU  Anesthesia Type: General  Level of Consciousness: awake, alert  and oriented  Airway and Oxygen Therapy: Patient Spontanous Breathing and Patient connected to nasal cannula oxygen  Post-op Pain: mild  Post-op Assessment: Post-op Vital signs reviewed and Patient's Cardiovascular Status Stable  Post-op Vital Signs: stable  Complications: No apparent anesthesia complications

## 2011-02-28 NOTE — Progress Notes (Signed)
Dr. Franky Macho rounded on patient on the unit at this time he was notified of her elevated BP from SBP 170s-180s. No new orders were given at this time. Will continue to monitor and assess.

## 2011-02-28 NOTE — Addendum Note (Signed)
Addendum  created 02/28/11 1922 by Melonie Florida, MD   Modules edited:Notes Section

## 2011-03-01 NOTE — Progress Notes (Addendum)
Subjective: Patient reports less pain, better swallowing.   Objective: Vital signs in last 24 hours: Temp:  [99.5 F (37.5 C)-100.1 F (37.8 C)] 99.7 F (37.6 C) (02/21 1558) Pulse Rate:  [80-109] 86  (02/21 1700) Resp:  [7-23] 14  (02/21 1700) BP: (114-178)/(67-98) 140/78 mmHg (02/21 1700) SpO2:  [92 %-100 %] 100 % (02/21 1700) Weight:  [65.8 kg (145 lb 1 oz)] 65.8 kg (145 lb 1 oz) (02/21 0400)  Intake/Output from previous day: 02/20 0701 - 02/21 0700 In: 4124 [P.O.:640; I.V.:3484] Out: 3560 [Urine:3410; Blood:150] Intake/Output this shift:    Moving all extremities well. Wounds clean, dry. Voice strong.  Lab Results:  Basename 02/28/11 1002  WBC --  HGB 9.2*  HCT 27.0*  PLT --   BMET  Basename 02/28/11 1002  NA 139  K 3.8  CL --  CO2 --  GLUCOSE 93  BUN --  CREATININE --  CALCIUM --    Studies/Results: Dg Cervical Spine 1 View  02/28/2011  *RADIOLOGY REPORT*  Clinical Data: C3-4 posterior fusion  DG CERVICAL SPINE - 1 VIEW  Comparison: 02/23/2011  Findings: Single C-arm image shows lateral mass screws in place at C3 and C4.  Procedure still in progress.  Previous anterior fusion.  IMPRESSION: Lateral mass screws in place at C3 and C4.  Original Report Authenticated By: Thomasenia Sales, M.D.   Dg Chest Port 1 View  02/28/2011  *RADIOLOGY REPORT*  Clinical Data: 71 year old female status post cervical fusion, right central line placement.  PORTABLE CHEST - 1 VIEW  Comparison: 02/21/2011 and earlier.  Findings: Portable semi upright AP view 1345 hours.  Right subclavian approach central venous catheter tip is 5 cm below the carina compatible with cavoatrial junction level placement.  Lung volumes remain low.  Stable cardiac size and mediastinal contours. No pneumothorax, pulmonary edema, pleural effusion or confluent pulmonary opacity.  Partially visible cervical fusion hardware.  IMPRESSION: 1.  Right subclavian central line, tip at the level of the cavoatrial  junction. 2.  No pneumothorax.  Low lung volumes.  Original Report Authenticated By: Harley Hallmark, M.D.    Assessment/Plan: Continue with pt.   LOS: 6 days     Byan Poplaski L 03/01/2011, 7:17 PM

## 2011-03-01 NOTE — Progress Notes (Signed)
Received pt from 3100 via bed. Pt alert and oriented, accompanied by family member.  Oriented to room. Pt quiet. States having some pain, but was medicated just prior to transfer.  Resting quietly.

## 2011-03-01 NOTE — Progress Notes (Signed)
OT Cancellation Note   Treatment cancelled today due to pt participating in PT session upon OT arrival.  Pt with significant pain and fatigue this AM per PT report.  Will re-attempt this afternoon as time allows.  Thanks!  03/01/2011 Cipriano Mile OTR/L Pager [phone] Office [phone]

## 2011-03-01 NOTE — Progress Notes (Signed)
Physical Therapy Treatment Patient Details Name: [name] MRN: [id number] DOB: [birth year] Today's Date: 03/01/2011  PT Assessment/Plan  PT - Assessment/Plan Comments on Treatment Session: Patient now S/P cervical laminectomy C3-6 for decompression. She presents today with increased weakness of lower extremities, decr attention to task and decr. problem solving abilities. She is also more fatigued which she relates to poor rest secondary to pain.We will continue to follow post-operatively for progression of her mobility within her post-op restrictions.   PT Plan: Discharge plan remains appropriate;Frequency remains appropriate Follow Up Recommendations: Inpatient Rehab Equipment Recommended: Defer to next venue PT Goals  Acute Rehab PT Goals PT Goal: Sit to Supine/Side - Progress: Progressing toward goal PT Goal: Sit to Stand - Progress: Progressing toward goal PT Goal: Stand to Sit - Progress: Progressing toward goal PT Goal: Ambulate - Progress: Progressing toward goal  PT Treatment Precautions/Restrictions  Precautions Precautions: Fall Precaution Comments: Cervical - handout supplied and educated patient on post-op restrictions. Required Braces or Orthoses: Yes Cervical Brace: Hard collar;Applied in supine position (at all times) Restrictions Weight Bearing Restrictions: No Mobility (including Balance) Bed Mobility Sit to Sidelying Right: 4: Min assist Sit to Sidelying Right Details (indicate cue type and reason): Patient with difficulty raising lower extremities and lowering her trunk - verbal cues for sequencing Transfers Sit to Stand: 3: Mod assist;With upper extremity assist;With armrests;From chair/3-in-1 Sit to Stand Details (indicate cue type and reason): Patient reporting new onset of right wrist area pain- RN aware and therefore unable to use right upper extremity to assist with transition. Lower extremitie appear weaker and so patient required increased assistance  for initiation. Verbal cues for hand placement. Stand to Sit: 4: Min assist;With upper extremity assist;To bed Stand to Sit Details: For control of descent. Verbal cues for hand placement. Also max. cues and assistance to navigate walker to required position pre-sitting Ambulation/Gait Ambulation/Gait Assistance: 3: Mod assist Ambulation/Gait Assistance Details (indicate cue type and reason): Assistance for balance and safety and periodically to navigate rolling walker as patient asking to ambulate to bed but steering more toward left side. Patient limited by fatigue. Ambulation Distance (Feet): 20 Feet Assistive device: Rolling walker Gait Pattern: Step-to pattern;Decreased stride length;Decreased hip/knee flexion - left;Decreased hip/knee flexion - right;Shuffle  Static Standing Balance Static Standing - Balance Support: Bilateral upper extremity supported Static Standing - Level of Assistance: 4: Min assist Static Standing - Comment/# of Minutes: For stability End of Session PT - End of Session Equipment Utilized During Treatment: Gait belt;Cervical collar Activity Tolerance: Patient limited by fatigue Patient left: in bed;with call bell in reach Nurse Communication: Mobility status for transfers;Mobility status for ambulation General Behavior During Session: [facility] for tasks performed Cognition: Impaired Cognitive Impairment: Decr. problem solving  Edwyna Perfect, PT  Pager [phone]  03/01/2011, 10:22 AM

## 2011-03-01 NOTE — Progress Notes (Signed)
Utilization review completed. Vonnie Spagnolo, RN, BSN. 03/01/11 

## 2011-03-01 NOTE — Progress Notes (Signed)
FL-2 completed and placed in shadow chart, along with 30-day note for MD signature and skilled nursing facility search initiated.   Genelle Bal, MSW, LCSW [phone]

## 2011-03-02 MED ORDER — SODIUM CHLORIDE 0.9 % IJ SOLN
10.0000 mL | INTRAMUSCULAR | Status: DC | PRN
  Administered 2011-03-04 (×2): 10 mL
  Administered 2011-03-05: 20 mL
  Administered 2011-03-06 (×2): 10 mL

## 2011-03-02 MED ORDER — SODIUM CHLORIDE 0.9 % IJ SOLN
10.0000 mL | Freq: Two times a day (BID) | INTRAMUSCULAR | Status: DC
  Administered 2011-03-02: 20 mL

## 2011-03-02 NOTE — Progress Notes (Signed)
Occupational Therapy Treatment Patient Details Name: [name] MRN: [id number] DOB: 1940-05-11 Today's Date: 03/02/2011  OT Assessment/Plan OT Assessment/Plan Comments on Treatment Session: Session focused on incorporating R UE into ADL activity.  Pt given theraputty and exercise information to begin HEP.  OT Plan: Discharge plan remains appropriate OT Frequency: Min 2X/week Recommendations for Other Services: Rehab consult Follow Up Recommendations: Inpatient Rehab Equipment Recommended: Defer to next venue OT Goals ADL Goals Pt Will Perform Eating: with set-up;with supervision;with adaptive utensils;Sitting, chair ADL Goal: Eating - Progress: Progressing toward goals Pt Will Perform Grooming: with set-up;with supervision;Standing at sink;with adaptive equipment ADL Goal: Grooming - Progress: Progressing toward goals Arm Goals Pt Will Complete Theraputty Exer: Independently;Min resistance putty;to increase strength;Right upper extremity Arm Goal: Theraputty Exercises - Progress: Goal set today  OT Treatment Precautions/Restrictions  Precautions Precautions: Fall Precaution Comments: Cervical - handout supplied and educated patient on post-op restrictions. Required Braces or Orthoses: Yes Cervical Brace: Hard collar;Applied in supine position   ADL ADL Eating/Feeding: Performed;Minimal assistance Eating/Feeding Details (indicate cue type and reason): assist for opening containers and straw.  Where Assessed - Eating/Feeding: Chair Grooming: Wash/dry face;Performed;Minimal assistance Grooming Details (indicate cue type and reason): Min assist to RUE Where Assessed - Grooming: Sitting, chair Mobility    Exercises    End of Session OT - End of Session Equipment Utilized During Treatment: Cervical collar Activity Tolerance: Patient limited by pain Patient left: in chair;with call bell in reach General Behavior During Session: [facility] for tasks performed Cognition:  Impaired Cognitive Impairment: Decr. problem solving  Cipriano Mile  03/02/2011, 1:22 PM 03/02/2011 Cipriano Mile OTR/L Pager [phone] Office [phone]

## 2011-03-02 NOTE — Progress Notes (Signed)
Patient given bed offers. Her preference is [facility] [place]. CSW advised patient that [facility] had not yet they responded, however contact will be made with facility on Monday to determine if they can make a bed offer.  Genelle Bal, MSW, LCSW [phone]

## 2011-03-02 NOTE — Progress Notes (Signed)
Physical Therapy Treatment Patient Details Name: [name] MRN: [id number] DOB: Apr 23, 1940 Today's Date: 03/02/2011  PT Assessment/Plan  PT - Assessment/Plan Comments on Treatment Session: Pt made excellent progress today with activity tolerance and mobility, but is limited in safety due to increased time required for all tasks and decreased righting reactions due to guarded posturing.  PT Plan: Discharge plan remains appropriate;Frequency remains appropriate Follow Up Recommendations: Inpatient Rehab Equipment Recommended: Defer to next venue PT Goals  Acute Rehab PT Goals PT Goal: Sit to Stand - Progress: Met PT Goal: Stand to Sit - Progress: Met PT Goal: Ambulate - Progress: Progressing toward goal  PT Treatment Precautions/Restrictions  Precautions Precautions: Fall Precaution Comments: Cervical - handout supplied and educated patient on post-op restrictions. Required Braces or Orthoses: Yes Cervical Brace: Hard collar Restrictions Weight Bearing Restrictions: No Mobility (including Balance) Bed Mobility Bed Mobility: No Transfers Sit to Stand: 5: Supervision;From toilet;With upper extremity assist Sit to Stand Details (indicate cue type and reason): Supervision for safety, increased time required due to pain.  Stand to Sit: 5: Supervision;With upper extremity assist;To chair/3-in-1 Stand to Sit Details: Pt needed several moments to figure out which hand she can comfortably place back on arm rest to control descent. Very guarded movement Ambulation/Gait Ambulation/Gait: Yes Ambulation/Gait Assistance: 4: Min assist (Min-guard) Ambulation/Gait Assistance Details (indicate cue type and reason): Min-guard, progressing to close S with very guarded gate  Ambulation Distance (Feet): 150 Feet Assistive device: Rolling walker Gait Pattern: Step-through pattern;Decreased stride length;Decreased trunk rotation Gait velocity: 0.19 ft/sec Stairs: No Wheelchair  Mobility Wheelchair Mobility: No  Posture/Postural Control Posture/Postural Control: No significant limitations Balance Balance Assessed: No Exercise    End of Session PT - End of Session Equipment Utilized During Treatment: Gait belt;Cervical collar Activity Tolerance: Patient limited by fatigue Patient left: in chair;with call bell in reach Nurse Communication: Mobility status for ambulation General Behavior During Session: [facility] for tasks performed Cognition: [facility] for tasks performed Cognitive Impairment: Decr. problem solving  Iona Coach 03/02/2011, 2:23 PM

## 2011-03-02 NOTE — Progress Notes (Signed)
Subjective: Patient reports her hands are feeling better. Moving better.  Objective: Vital signs in last 24 hours: Temp:  [97.5 F (36.4 C)-99.7 F (37.6 C)] 97.5 F (36.4 C) (02/22 2200) Pulse Rate:  [85-101] 92  (02/22 2200) Resp:  [18-20] 19  (02/22 2200) BP: (110-163)/(77-93) 124/78 mmHg (02/22 2200) SpO2:  [92 %-99 %] 92 % (02/22 2200)  Intake/Output from previous day: 02/21 0701 - 02/22 0700 In: 720 [P.O.:720] Out: 2080 [Urine:2080] Intake/Output this shift: Total I/O In: 240 [P.O.:240] Out: -   Neurologic: Mental status: Alert, oriented, thought content appropriate Motor: Improved: hands and upper extremities grade 4  Lab Results:  Basename 02/28/11 1002  WBC --  HGB 9.2*  HCT 27.0*  PLT --   BMET  Basename 02/28/11 1002  NA 139  K 3.8  CL --  CO2 --  GLUCOSE 93  BUN --  CREATININE --  CALCIUM --    Studies/Results: No results found.  Assessment/Plan: Awaiting placement. Continue with PT. Wound is clean dry and without signs of infection.  LOS: 7 days     Jordon Bourquin L 03/02/2011, 11:59 PM

## 2011-03-03 NOTE — Progress Notes (Signed)
Patient ID: [name], female   DOB: 1940/05/20, 71 y.o.   MRN: [id number] Subjective: Patient reports eeling better  Objective: Vital signs in last 24 hours: Temp:  [97.5 F (36.4 C)-100.9 F (38.3 C)] 100.1 F (37.8 C) (02/23 0600) Pulse Rate:  [83-101] 83  (02/23 0600) Resp:  [18-20] 19  (02/23 0600) BP: (107-133)/(76-95) 107/76 mmHg (02/23 0600) SpO2:  [92 %-98 %] 98 % (02/23 0600)  Intake/Output from previous day: 02/22 0701 - 02/23 0700 In: 1160 [P.O.:1160] Out: 500 [Urine:500] Intake/Output this shift:    Strength stable. Wound fine  Lab Results:  Basename 02/28/11 1002  WBC --  HGB 9.2*  HCT 27.0*  PLT --   BMET  Basename 02/28/11 1002  NA 139  K 3.8  CL --  CO2 --  GLUCOSE 93  BUN --  CREATININE --  CALCIUM --    Studies/Results: No results found.  Assessment/Plan: Doing well. Plan is for placement and hopefully should have a place to go on Monday.  LOS: 8 days  As above   Reinaldo Meeker, MD 03/03/2011, 8:39 AM

## 2011-03-04 NOTE — Progress Notes (Signed)
Ms. Hietpas appears to be recovering well from her surgeries. No arm pain, arm strength improving. The forearm wounds resulting from an IV infiltration appear to be healing well. No pain or drainage, edema improved.  Kipp Brood, MD

## 2011-03-04 NOTE — Progress Notes (Signed)
Patient ID: [name], female   DOB: Jan 13, 1940, 71 y.o.   MRN: [id number] NEUROSURGERY PROGRESS NOTE  Doing well. Complains of appropriate neck soreness but improving. No arm pain No numbness, tingling or weakness  Seems to have Good strength and sensation Incision CDI  Temp:  [97.1 F (36.2 C)-100.2 F (37.9 C)] 100.2 F (37.9 C) (02/23 2238) Pulse Rate:  [80-98] 85  (02/23 2238) Resp:  [18] 18  (02/23 2238) BP: (103-120)/(68-77) 115/77 mmHg (02/23 2238) SpO2:  [87 %-98 %] 98 % (02/23 2238)  Plan: Awaiting placement  Marice Angelino S, MD 03/04/2011 6:59 AM

## 2011-03-04 NOTE — Addendum Note (Signed)
Addendum  created 03/04/11 1929 by Melonie Florida, MD   Modules edited:Notes Section

## 2011-03-05 ENCOUNTER — Encounter (HOSPITAL_COMMUNITY): Payer: Self-pay | Admitting: Neurosurgery

## 2011-03-05 NOTE — Progress Notes (Signed)
Physical Therapy Treatment Patient Details Name: TERRILL WAUTERS MRN: [id number] DOB: 23-Dec-1940 Today's Date: 03/05/2011  PT Assessment/Plan  PT - Assessment/Plan Comments on Treatment Session: Pt continues to be slow to process and needs extra time to move and complete task.  Encouraged increased gait speed and overall mobility. PT Plan: Frequency remains appropriate;Discharge plan needs to be updated PT Frequency: Min 5X/week Follow Up Recommendations: Skilled nursing facility Equipment Recommended: Defer to next venue PT Goals  Acute Rehab PT Goals PT Goal Formulation: With patient Time For Goal Achievement: 2 weeks Pt will go Supine/Side to Sit: with modified independence Pt will go Sit to Supine/Side: with modified independence Pt will go Sit to Stand: with supervision PT Goal: Sit to Stand - Progress: Met Pt will go Stand to Sit: with supervision PT Goal: Stand to Sit - Progress: Met Pt will Ambulate: 51 - 150 feet;with supervision;with least restrictive assistive device PT Goal: Ambulate - Progress: Progressing toward goal  PT Treatment Precautions/Restrictions  Precautions Precautions: Fall Precaution Comments: Cervical - handout supplied and educated patient on post-op restrictions. Required Braces or Orthoses: Yes Cervical Brace: Hard collar Restrictions Weight Bearing Restrictions: No Mobility (including Balance) Bed Mobility Bed Mobility: No Transfers Sit to Stand: 5: Supervision;From chair/3-in-1 Sit to Stand Details (indicate cue type and reason): Supervision for safety with cues for hand placement.  Pt very slow to move and needs extra time to complete task. Stand to Sit: 5: Supervision;To chair/3-in-1;With armrests Stand to Sit Details: VCs for hand placement Ambulation/Gait Ambulation/Gait: Yes Ambulation/Gait Assistance Details (indicate cue type and reason): Minguard for safety with cues for RW placement.   Pt ambulates with very guarded and slow cadence  and needs max cues to increase gait. Ambulation Distance (Feet): 150 Feet Assistive device: Rolling walker Gait Pattern: Step-through pattern;Decreased stride length;Decreased trunk rotation Gait velocity: 0.5 ft/sec Stairs: No Wheelchair Mobility Wheelchair Mobility: No  Posture/Postural Control Posture/Postural Control: No significant limitations Balance Balance Assessed: No Exercise    End of Session PT - End of Session Equipment Utilized During Treatment: Gait belt;Cervical collar Activity Tolerance: Patient limited by fatigue Patient left: in chair;with call bell in reach Nurse Communication: Mobility status for ambulation General Behavior During Session: [facility] for tasks performed Cognition: [facility] for tasks performed Cognitive Impairment: Decr. problem solving  Jaramiah Bossard 03/05/2011, 12:56 PM Pager:  [phone]

## 2011-03-05 NOTE — Progress Notes (Signed)
Patient ID: [name], female   DOB: 07-15-1940, 71 y.o.   MRN: [id number] BP 112/78  Pulse 92  Temp(Src) 99.3 F (37.4 C) (Oral)  Resp 18  Ht 5\' 5"  (1.651 m)  Wt 65.8 kg (145 lb 1 oz)  BMI 24.14 kg/m2  SpO2 96% Alert and oriented x4 Moving all extremities Wound clean and dry.  Dc tommorow

## 2011-03-05 NOTE — Progress Notes (Signed)
CSW met with pt to provide bed offers. Pt has chosen [facility]. CSW contacted to confirm bed offer. Awaiting [facility] #. CSW will continue to follow to facilitate discharge to [facility] [facility] tomorrow, 03/06/11.   Dede Query, MSW, Theresia Majors [phone]

## 2011-03-05 NOTE — Progress Notes (Signed)
Utilization review completed. Cookie Pore, RN, BSN.  03/05/11 

## 2011-03-06 MED ORDER — CYCLOBENZAPRINE HCL 10 MG PO TABS: 10.0000 mg | ORAL_TABLET | Freq: Three times a day (TID) | ORAL | Status: AC | PRN

## 2011-03-06 MED ORDER — HYDROCODONE-ACETAMINOPHEN 5-325 MG PO TABS: 1.0000 | ORAL_TABLET | Freq: Four times a day (QID) | ORAL | Status: AC | PRN

## 2011-03-06 NOTE — Progress Notes (Signed)
Pt is ready for discharge to [facility] in [place]. Facility has received discharge summary and is ready to accept pt. Pt's son is completed admission paperwork. Pt is agreeable to discharge plan. [facility] is providing transportation to facility. CSW is signing off as no further needs have been identified.   Dede Query, MSW, Theresia Majors [phone]

## 2011-03-06 NOTE — Discharge Summary (Signed)
Physician Discharge Summary  Patient ID: [name] MRN: [id number] DOB/AGE: [birth year] 69 y.o.  Admit date: 02/23/2011 Discharge date: 03/06/2011  Admission Diagnoses:Cervical spondylosis with myelopathy C3-6, cervical stenosis C3-6,   Discharge Diagnoses:  Principal Problem:  *Cervical spondylosis with myelopathy   Discharged Condition: good  Hospital Course: [name] was admitted and taken to the OR for Cervical decompression from C3-6 via a C4,5 corpectomy, and anterior fusion from C3-6 with a Peek interbody and plate. She then later during the hospitalization taken to the OR for a Posterior fusion from C3-6 with laminectomy. She has done well with both operations. Wounds are clean and dry. Voice is strong. Tolerating a regular diet,and ambulating.  Consults: None  Significant Diagnostic Studies: none  Treatments: surgery: Cervical Corpectomy C4,5, Anterior arthrodesis C3-6, Peek Interbody(Trestle hardware), Posterior Cervical arthrodesis C3-6, lateral mass screws C3-6 (medtronic hardware).  Discharge Exam: Blood pressure 114/77, pulse 66, temperature 98 F (36.7 C), temperature source Oral, resp. rate 17, height 5\' 5"  (1.651 m), weight 65.8 kg (145 lb 1 oz), SpO2 96.00%. General appearance: alert, cooperative and appears stated age Neurologic: Alert and oriented X 3, normal strength and tone. Normal symmetric reflexes. Normal coordination and gait  Disposition:    Medication List  As of 03/06/2011  1:21 PM   TAKE these medications         carvedilol 3.125 MG tablet   Commonly known as: COREG   Take 3.125 mg by mouth daily.      cyclobenzaprine 10 MG tablet   Commonly known as: FLEXERIL   Take 1 tablet (10 mg total) by mouth 3 (three) times daily as needed for muscle spasms.      glipiZIDE 5 MG 24 hr tablet   Commonly known as: GLUCOTROL XL   Take 5 mg by mouth daily.      HYDROcodone-acetaminophen 5-325 MG per tablet   Commonly known as: NORCO   Take 1  tablet by mouth every 6 (six) hours as needed.      levothyroxine 88 MCG tablet   Commonly known as: SYNTHROID, LEVOTHROID   Take 88 mcg by mouth daily.      losartan-hydrochlorothiazide 100-25 MG per tablet   Commonly known as: HYZAAR   Take 1 tablet by mouth daily.      metFORMIN 500 MG 24 hr tablet   Commonly known as: GLUCOPHAGE-XR   Take 1,500 mg by mouth at bedtime.      mulitivitamin with minerals Tabs   Take 1 tablet by mouth daily.      rosuvastatin 5 MG tablet   Commonly known as: CRESTOR   Take 5 mg by mouth daily.           Follow-up Information    Follow up with Hervey Wedig L, MD in 2 weeks. (staple removal)    Contact information:   [address] [place] [address] [phone]          Signed: Carmela Hurt 03/06/2011, 1:21 PM

## 2011-03-06 NOTE — Progress Notes (Signed)
Physical Therapy Treatment Patient Details Name: [name] MRN: [id number] DOB: Sep 29, 1940 Today's Date: 03/06/2011  PT Assessment/Plan  PT - Assessment/Plan Comments on Treatment Session: Pt able to increase ambulation distance this session.  Pt able to perform reaching using RUE.  Pt may d/c to SNF today for further therapy. PT Plan: Frequency remains appropriate;Discharge plan needs to be updated PT Frequency: Min 5X/week Follow Up Recommendations: Skilled nursing facility Equipment Recommended: Defer to next venue PT Goals  Acute Rehab PT Goals PT Goal Formulation: With patient Time For Goal Achievement: 2 weeks Pt will go Sit to Stand: with supervision PT Goal: Sit to Stand - Progress: Progressing toward goal Pt will go Stand to Sit: with supervision PT Goal: Stand to Sit - Progress: Progressing toward goal Pt will Transfer Bed to Chair/Chair to Bed: with min assist PT Transfer Goal: Bed to Chair/Chair to Bed - Progress: Progressing toward goal Pt will Ambulate: 51 - 150 feet;with supervision;with least restrictive assistive device PT Goal: Ambulate - Progress: Progressing toward goal  PT Treatment Precautions/Restrictions  Precautions Precautions: Fall Precaution Comments: Cervical - handout supplied and educated patient on post-op restrictions. Required Braces or Orthoses: Yes Cervical Brace: Hard collar Restrictions Weight Bearing Restrictions: No Mobility (including Balance) Bed Mobility Bed Mobility: No Transfers Sit to Stand: 5: Supervision;From chair/3-in-1 Sit to Stand Details (indicate cue type and reason):   Min verbal cues for hand placement on RW  Stand to Sit: 5: Supervision;To chair/3-in-1;With armrests Ambulation/Gait Ambulation/Gait: Yes Ambulation/Gait Assistance:  (minguard) Ambulation/Gait Assistance Details (indicate cue type and reason): Minguard for safety with cues for RW placement.  Pt able to increase ambulation speed but continues to be  slow. Ambulation Distance (Feet): 200 Feet Assistive device: Rolling walker Gait Pattern: Step-through pattern;Decreased stride length;Decreased trunk rotation Gait velocity: .71 ft/ sec Stairs: No Wheelchair Mobility Wheelchair Mobility: No  Posture/Postural Control Posture/Postural Control: No significant limitations Balance Balance Assessed: Yes Static Standing Balance Static Standing - Balance Support: Left upper extremity supported Static Standing - Level of Assistance: 5: Stand by assistance Static Standing - Comment/# of Minutes: ~ performing reaching in standing out of BOS with bilateral hand reaching. Exercise  Other Exercises Other Exercises: Pt. completed bil UE reaching exercises standing at the sink to increase ROM with bil shoulder flexion. Pt. needed increased time to achieve ~70degrees rt. shoulder flexion for tapping faucet at midline and completed 10reps each side with close supervision for balance. End of Session PT - End of Session Equipment Utilized During Treatment: Gait belt;Cervical collar Activity Tolerance: Patient tolerated treatment well Patient left: in chair;with call bell in reach Nurse Communication: Mobility status for ambulation General Behavior During Session: [facility] for tasks performed Cognition: [facility] for tasks performed Cognitive Impairment: Pt. requires increased time for problem solving  [name] 03/06/2011, 12:57 PM Pager:  [phone]

## 2011-03-06 NOTE — Progress Notes (Signed)
Occupational Therapy Treatment Patient Details Name: [name] MRN: [id number] DOB: 09/29/1940 Today's Date: 03/06/2011  OT Assessment/Plan OT Assessment/Plan Comments on Treatment Session: Pt. improved today and focused on Rt. UE strengthening to increase functional use with ADL tasks.  OT Plan: Discharge plan remains appropriate OT Frequency: Min 2X/week Recommendations for Other Services: Rehab consult Follow Up Recommendations: Inpatient Rehab Equipment Recommended: Defer to next venue OT Goals Acute Rehab OT Goals OT Goal Formulation: With patient Time For Goal Achievement: 2 weeks ADL Goals Pt Will Perform Eating: with set-up;with supervision;with adaptive utensils;Sitting, chair ADL Goal: Eating - Progress: Progressing toward goals Pt Will Perform Grooming: with set-up;with supervision;Standing at sink;with adaptive equipment ADL Goal: Grooming - Progress: Met Pt Will Perform Upper Body Dressing: with min assist;Standing ADL Goal: Upper Body Dressing - Progress: Met Pt Will Transfer to Toilet: with min assist;Ambulation;with DME;3-in-1 ADL Goal: Toilet Transfer - Progress: Met Arm Goals Pt Will Complete Theraputty Exer: Independently;Min resistance putty;to increase strength;Right upper extremity Arm Goal: Theraputty Exercises - Progress: Progressing toward goal  OT Treatment Precautions/Restrictions  Precautions Precautions: Fall Required Braces or Orthoses: Yes Cervical Brace: Hard collar Restrictions Weight Bearing Restrictions: No   ADL ADL Grooming: Performed;Wash/dry face;Teeth care;Set up;Supervision/safety Grooming Details (indicate cue type and reason): Close supervision and min verbal cues for use of RW Where Assessed - Grooming: Standing at sink Upper Body Dressing: Performed;Minimal assistance Upper Body Dressing Details (indicate cue type and reason): with donning gown Where Assessed - Upper Body Dressing: Standing Toilet Transfer:  Simulated;Other (comment) (min guard assist) Toilet Transfer Details (indicate cue type and reason): Min verbal cues for hand placement on RW for safety Mobility  Transfers Sit to Stand: 5: Supervision;From chair/3-in-1 Sit to Stand Details (indicate cue type and reason): Min verbal cues for hand placement on RW Exercises Other Exercises Other Exercises: Pt. completed bil UE reaching exercises standing at the sink to increase ROM with bil shoulder flexion. Pt. needed increased time to achieve ~70degrees rt. shoulder flexion for tapping faucet at midline and completed 10reps each side with close supervision for balance.  End of Session OT - End of Session Equipment Utilized During Treatment: Cervical collar Activity Tolerance: Patient tolerated treatment well Patient left: in chair;with call bell in reach Nurse Communication: Mobility status for transfers General Behavior During Session: [facility] for tasks performed Cognition: [facility] for tasks performed Cognitive Impairment: Pt. requires increased time for problem solving  Abia Monaco, OTR/L Pager [phone]  03/06/2011, 11:21 AM

## 2011-03-06 NOTE — Discharge Instructions (Signed)
Anterior Cervical Fusion Care After Pinching of the nerves is a common cause of long-term pain. When this happens, a procedure called an anterior cervical fusion is sometimes performed. It relieves the pressure on the pinched nerve roots or spinal cord in the neck. An anterior cervical fusion means that the operation is done through the front (anterior) of your neck to fuse bones in your neck together. This procedure is done to relieve the pressure on pinched nerve roots or spinal cord. This operation is done to control the movement of your spine, which may be pressing on the nerves. This may relieve the pain. The procedure that stops the movement of the spine is called a fusion. The cut by the surgeon (incision) is usually within a skin fold line under your chin. After moving the neck muscles gently apart, the neurosurgeon uses an operating microscope and removes the injured intervertebral disk (the cushion or pad of tissue between the bones of the spine). This takes the pressure off the nerves or spinal cord. This is called decompression. The area where the disc was removed is then filled with a bone graft. The graft will fuse the vertebrae together over time. This means it causes the vertebral bodies to grow together. The bone graft may be obtained from your own bone (your hip for example), or may be obtained from a bone bank. Receiving bone from a bone bank is similar to a blood bank, only the bone comes from human donors who have recently died. This type of graft is referred to as allograft bone. The preformed bone plug is safe and will not be rejected by your body. It does not contain blood cells. In some cases, the surgeon may use hardware in your neck to help stabilize it. This means that metal plates or pins or screws may be used to:  Provide extra support to the neck.   Help the bones to grow together more easily.  A cervical fusion procedure takes a couple hours to several hours, depending on  what needs to be done. Your caregiver will be able to answer your questions for you. HOME CARE INSTRUCTIONS   It will be normal to have a sore throat and have difficulty swallowing foods for a couple weeks following surgery. See your caregiver if this seems to be getting worse rather than better.   You may resume normal diet and activities as directed or allowed. Generally, walking and stair climbing are fine. Avoid lifting more than ten pounds and do no lifting above your head.   If given a cervical collar, remove only for bathing and eating, or as directed.   Use only showers for cleaning up, with no bathing, until seen.   You may apply ice to the surgical or bone donor site for 15 to 20 minutes each hour while awake for the first couple days following surgery. Put the ice in a plastic bag and place a towel between the bag of ice and your skin.   Change dressings if necessary or as directed.   Avoid driving a car until given the OK by your surgeon.   Take prescribed medication as directed. Only take over-the-counter or prescription medicines for pain, discomfort, or fever as directed by your caregiver.   Make an appointment to see your caregiver for suture or staple removal when instructed.   If physical therapy was prescribed, follow your caregiver's directions.  SEEK IMMEDIATE MEDICAL CARE IF:  There is redness, swelling, or increasing pain in the  wound.   There is pus coming from the wound.   An unexplained oral temperature over 102 F (38.9 C) develops.   There is a bad smell coming from the wound or dressing.   You have swelling in your calf or leg.   You develop shortness of breath or chest pain.   The wound edges break open after sutures or staples have been removed.   Your pain is not controlled with medicine.   You seem to be getting worse rather than better.  Document Released: 08/09/2003 Document Revised: 09/06/2010 Document Reviewed: 10/15/2007 [facility]  Patient Information 2012 [place], [place].

## 2011-08-23 ENCOUNTER — Other Ambulatory Visit: Payer: Self-pay | Admitting: Family Medicine

## 2011-08-23 DIAGNOSIS — Z1239 Encounter for other screening for malignant neoplasm of breast: Secondary | ICD-10-CM

## 2011-08-23 DIAGNOSIS — Z1231 Encounter for screening mammogram for malignant neoplasm of breast: Secondary | ICD-10-CM

## 2011-08-24 ENCOUNTER — Other Ambulatory Visit: Payer: Self-pay | Admitting: Obstetrics and Gynecology

## 2011-08-28 ENCOUNTER — Ambulatory Visit: Payer: Medicare Other

## 2011-10-08 ENCOUNTER — Ambulatory Visit: Payer: Medicare Other

## 2012-01-17 ENCOUNTER — Other Ambulatory Visit: Payer: Self-pay | Admitting: Family Medicine

## 2012-01-17 DIAGNOSIS — N951 Menopausal and female climacteric states: Secondary | ICD-10-CM

## 2012-03-03 ENCOUNTER — Ambulatory Visit: Payer: Medicare Other

## 2012-03-03 ENCOUNTER — Other Ambulatory Visit: Payer: Medicare Other

## 2012-03-27 ENCOUNTER — Ambulatory Visit
Admission: RE | Admit: 2012-03-27 | Discharge: 2012-03-27 | Disposition: A | Discharge status: Home / Self Care | Payer: Medicare Other | Source: Ambulatory Visit | Attending: Family Medicine | Admitting: Family Medicine

## 2012-03-27 DIAGNOSIS — Z1231 Encounter for screening mammogram for malignant neoplasm of breast: Secondary | ICD-10-CM

## 2012-03-28 ENCOUNTER — Other Ambulatory Visit: Payer: Self-pay | Admitting: Family Medicine

## 2012-03-28 DIAGNOSIS — R928 Other abnormal and inconclusive findings on diagnostic imaging of breast: Secondary | ICD-10-CM

## 2012-04-22 ENCOUNTER — Ambulatory Visit: Payer: Medicare Other

## 2012-04-22 ENCOUNTER — Other Ambulatory Visit: Payer: Self-pay | Admitting: Family Medicine

## 2012-04-22 ENCOUNTER — Ambulatory Visit
Admission: RE | Admit: 2012-04-22 | Discharge: 2012-04-22 | Disposition: A | Discharge status: Home / Self Care | Payer: Medicare Other | Source: Ambulatory Visit | Attending: Family Medicine | Admitting: Family Medicine

## 2012-04-22 DIAGNOSIS — R928 Other abnormal and inconclusive findings on diagnostic imaging of breast: Secondary | ICD-10-CM

## 2012-04-22 DIAGNOSIS — R921 Mammographic calcification found on diagnostic imaging of breast: Secondary | ICD-10-CM

## 2012-04-25 ENCOUNTER — Ambulatory Visit
Admission: RE | Admit: 2012-04-25 | Discharge: 2012-04-25 | Disposition: A | Discharge status: Home / Self Care | Payer: Medicare Other | Source: Ambulatory Visit | Attending: Family Medicine | Admitting: Family Medicine

## 2012-04-25 DIAGNOSIS — R921 Mammographic calcification found on diagnostic imaging of breast: Secondary | ICD-10-CM

## 2014-01-18 DIAGNOSIS — J069 Acute upper respiratory infection, unspecified: Secondary | ICD-10-CM | POA: Diagnosis not present

## 2014-03-24 DIAGNOSIS — Z6830 Body mass index (BMI) 30.0-30.9, adult: Secondary | ICD-10-CM | POA: Diagnosis not present

## 2014-03-24 DIAGNOSIS — I10 Essential (primary) hypertension: Secondary | ICD-10-CM | POA: Diagnosis not present

## 2014-03-24 DIAGNOSIS — E890 Postprocedural hypothyroidism: Secondary | ICD-10-CM | POA: Diagnosis not present

## 2014-03-24 DIAGNOSIS — F329 Major depressive disorder, single episode, unspecified: Secondary | ICD-10-CM | POA: Diagnosis not present

## 2014-03-24 DIAGNOSIS — E119 Type 2 diabetes mellitus without complications: Secondary | ICD-10-CM | POA: Diagnosis not present

## 2014-03-24 DIAGNOSIS — E782 Mixed hyperlipidemia: Secondary | ICD-10-CM | POA: Diagnosis not present

## 2014-04-19 ENCOUNTER — Encounter: Payer: Self-pay | Admitting: Internal Medicine

## 2014-05-13 DIAGNOSIS — H04123 Dry eye syndrome of bilateral lacrimal glands: Secondary | ICD-10-CM | POA: Diagnosis not present

## 2014-05-13 DIAGNOSIS — H40003 Preglaucoma, unspecified, bilateral: Secondary | ICD-10-CM | POA: Diagnosis not present

## 2014-05-13 DIAGNOSIS — H524 Presbyopia: Secondary | ICD-10-CM | POA: Diagnosis not present

## 2014-06-04 DIAGNOSIS — H4011X1 Primary open-angle glaucoma, mild stage: Secondary | ICD-10-CM | POA: Diagnosis not present

## 2014-07-14 DIAGNOSIS — Z6830 Body mass index (BMI) 30.0-30.9, adult: Secondary | ICD-10-CM | POA: Diagnosis not present

## 2014-07-14 DIAGNOSIS — T63441A Toxic effect of venom of bees, accidental (unintentional), initial encounter: Secondary | ICD-10-CM | POA: Diagnosis not present

## 2014-08-02 ENCOUNTER — Other Ambulatory Visit: Payer: Self-pay | Admitting: Family Medicine

## 2014-08-02 DIAGNOSIS — E890 Postprocedural hypothyroidism: Secondary | ICD-10-CM | POA: Diagnosis not present

## 2014-08-02 DIAGNOSIS — I10 Essential (primary) hypertension: Secondary | ICD-10-CM | POA: Diagnosis not present

## 2014-08-02 DIAGNOSIS — M2560 Stiffness of unspecified joint, not elsewhere classified: Secondary | ICD-10-CM | POA: Diagnosis not present

## 2014-08-02 DIAGNOSIS — Z1231 Encounter for screening mammogram for malignant neoplasm of breast: Secondary | ICD-10-CM | POA: Diagnosis not present

## 2014-08-02 DIAGNOSIS — E119 Type 2 diabetes mellitus without complications: Secondary | ICD-10-CM | POA: Diagnosis not present

## 2014-08-02 DIAGNOSIS — E782 Mixed hyperlipidemia: Secondary | ICD-10-CM | POA: Diagnosis not present

## 2014-08-02 DIAGNOSIS — Z6830 Body mass index (BMI) 30.0-30.9, adult: Secondary | ICD-10-CM | POA: Diagnosis not present

## 2014-08-02 DIAGNOSIS — Z139 Encounter for screening, unspecified: Secondary | ICD-10-CM | POA: Diagnosis not present

## 2014-08-17 DIAGNOSIS — E876 Hypokalemia: Secondary | ICD-10-CM | POA: Diagnosis not present

## 2014-08-19 ENCOUNTER — Ambulatory Visit: Payer: Self-pay

## 2014-09-29 ENCOUNTER — Encounter: Payer: Self-pay | Admitting: Internal Medicine

## 2014-10-20 ENCOUNTER — Ambulatory Visit: Payer: Self-pay

## 2014-12-07 DIAGNOSIS — K219 Gastro-esophageal reflux disease without esophagitis: Secondary | ICD-10-CM | POA: Diagnosis not present

## 2014-12-07 DIAGNOSIS — I10 Essential (primary) hypertension: Secondary | ICD-10-CM | POA: Diagnosis not present

## 2014-12-07 DIAGNOSIS — E782 Mixed hyperlipidemia: Secondary | ICD-10-CM | POA: Diagnosis not present

## 2014-12-07 DIAGNOSIS — E669 Obesity, unspecified: Secondary | ICD-10-CM | POA: Diagnosis not present

## 2014-12-07 DIAGNOSIS — E119 Type 2 diabetes mellitus without complications: Secondary | ICD-10-CM | POA: Diagnosis not present

## 2014-12-07 DIAGNOSIS — Z9181 History of falling: Secondary | ICD-10-CM | POA: Diagnosis not present

## 2014-12-07 DIAGNOSIS — Z6830 Body mass index (BMI) 30.0-30.9, adult: Secondary | ICD-10-CM | POA: Diagnosis not present

## 2014-12-07 DIAGNOSIS — Z1389 Encounter for screening for other disorder: Secondary | ICD-10-CM | POA: Diagnosis not present

## 2014-12-07 DIAGNOSIS — E890 Postprocedural hypothyroidism: Secondary | ICD-10-CM | POA: Diagnosis not present

## 2015-04-11 DIAGNOSIS — E890 Postprocedural hypothyroidism: Secondary | ICD-10-CM | POA: Diagnosis not present

## 2015-04-11 DIAGNOSIS — Z888 Allergy status to other drugs, medicaments and biological substances status: Secondary | ICD-10-CM | POA: Diagnosis not present

## 2015-04-11 DIAGNOSIS — E782 Mixed hyperlipidemia: Secondary | ICD-10-CM | POA: Diagnosis not present

## 2015-04-11 DIAGNOSIS — Z6831 Body mass index (BMI) 31.0-31.9, adult: Secondary | ICD-10-CM | POA: Diagnosis not present

## 2015-04-11 DIAGNOSIS — F329 Major depressive disorder, single episode, unspecified: Secondary | ICD-10-CM | POA: Diagnosis not present

## 2015-04-11 DIAGNOSIS — I10 Essential (primary) hypertension: Secondary | ICD-10-CM | POA: Diagnosis not present

## 2015-04-11 DIAGNOSIS — E876 Hypokalemia: Secondary | ICD-10-CM | POA: Diagnosis not present

## 2015-04-11 DIAGNOSIS — E119 Type 2 diabetes mellitus without complications: Secondary | ICD-10-CM | POA: Diagnosis not present

## 2015-04-26 ENCOUNTER — Encounter: Payer: Self-pay | Admitting: Internal Medicine

## 2015-05-25 DIAGNOSIS — Z6831 Body mass index (BMI) 31.0-31.9, adult: Secondary | ICD-10-CM | POA: Diagnosis not present

## 2015-05-25 DIAGNOSIS — M533 Sacrococcygeal disorders, not elsewhere classified: Secondary | ICD-10-CM | POA: Diagnosis not present

## 2015-08-17 DIAGNOSIS — R350 Frequency of micturition: Secondary | ICD-10-CM | POA: Diagnosis not present

## 2015-08-17 DIAGNOSIS — E782 Mixed hyperlipidemia: Secondary | ICD-10-CM | POA: Diagnosis not present

## 2015-08-17 DIAGNOSIS — E890 Postprocedural hypothyroidism: Secondary | ICD-10-CM | POA: Diagnosis not present

## 2015-08-17 DIAGNOSIS — F329 Major depressive disorder, single episode, unspecified: Secondary | ICD-10-CM | POA: Diagnosis not present

## 2015-08-17 DIAGNOSIS — E119 Type 2 diabetes mellitus without complications: Secondary | ICD-10-CM | POA: Diagnosis not present

## 2015-08-17 DIAGNOSIS — I10 Essential (primary) hypertension: Secondary | ICD-10-CM | POA: Diagnosis not present

## 2015-08-17 DIAGNOSIS — Z6830 Body mass index (BMI) 30.0-30.9, adult: Secondary | ICD-10-CM | POA: Diagnosis not present

## 2015-08-17 DIAGNOSIS — R3129 Other microscopic hematuria: Secondary | ICD-10-CM | POA: Diagnosis not present

## 2015-12-16 ENCOUNTER — Other Ambulatory Visit: Payer: Self-pay | Admitting: Physician Assistant

## 2015-12-16 ENCOUNTER — Other Ambulatory Visit: Payer: Self-pay | Admitting: Nurse Practitioner

## 2015-12-16 DIAGNOSIS — I10 Essential (primary) hypertension: Secondary | ICD-10-CM | POA: Diagnosis not present

## 2015-12-16 DIAGNOSIS — Z9181 History of falling: Secondary | ICD-10-CM | POA: Diagnosis not present

## 2015-12-16 DIAGNOSIS — E669 Obesity, unspecified: Secondary | ICD-10-CM | POA: Diagnosis not present

## 2015-12-16 DIAGNOSIS — E2839 Other primary ovarian failure: Secondary | ICD-10-CM

## 2015-12-16 DIAGNOSIS — F329 Major depressive disorder, single episode, unspecified: Secondary | ICD-10-CM | POA: Diagnosis not present

## 2015-12-16 DIAGNOSIS — Z1389 Encounter for screening for other disorder: Secondary | ICD-10-CM | POA: Diagnosis not present

## 2015-12-16 DIAGNOSIS — E890 Postprocedural hypothyroidism: Secondary | ICD-10-CM | POA: Diagnosis not present

## 2015-12-16 DIAGNOSIS — R921 Mammographic calcification found on diagnostic imaging of breast: Secondary | ICD-10-CM

## 2015-12-16 DIAGNOSIS — E119 Type 2 diabetes mellitus without complications: Secondary | ICD-10-CM | POA: Diagnosis not present

## 2015-12-16 DIAGNOSIS — Z6830 Body mass index (BMI) 30.0-30.9, adult: Secondary | ICD-10-CM | POA: Diagnosis not present

## 2015-12-16 DIAGNOSIS — E782 Mixed hyperlipidemia: Secondary | ICD-10-CM | POA: Diagnosis not present

## 2015-12-28 ENCOUNTER — Ambulatory Visit
Admission: RE | Admit: 2015-12-28 | Discharge: 2015-12-28 | Disposition: A | Discharge status: Home / Self Care | Payer: Commercial Managed Care - HMO | Source: Ambulatory Visit | Attending: Nurse Practitioner | Admitting: Nurse Practitioner

## 2015-12-28 DIAGNOSIS — Z13820 Encounter for screening for osteoporosis: Secondary | ICD-10-CM | POA: Diagnosis not present

## 2015-12-28 DIAGNOSIS — R921 Mammographic calcification found on diagnostic imaging of breast: Secondary | ICD-10-CM

## 2015-12-28 DIAGNOSIS — E2839 Other primary ovarian failure: Secondary | ICD-10-CM

## 2015-12-28 DIAGNOSIS — Z780 Asymptomatic menopausal state: Secondary | ICD-10-CM | POA: Diagnosis not present

## 2016-04-17 ENCOUNTER — Encounter: Payer: Self-pay | Admitting: Internal Medicine

## 2016-04-17 DIAGNOSIS — Z139 Encounter for screening, unspecified: Secondary | ICD-10-CM | POA: Diagnosis not present

## 2016-04-17 DIAGNOSIS — E890 Postprocedural hypothyroidism: Secondary | ICD-10-CM | POA: Diagnosis not present

## 2016-04-17 DIAGNOSIS — Z1211 Encounter for screening for malignant neoplasm of colon: Secondary | ICD-10-CM | POA: Diagnosis not present

## 2016-04-17 DIAGNOSIS — E782 Mixed hyperlipidemia: Secondary | ICD-10-CM | POA: Diagnosis not present

## 2016-04-17 DIAGNOSIS — Z6830 Body mass index (BMI) 30.0-30.9, adult: Secondary | ICD-10-CM | POA: Diagnosis not present

## 2016-04-17 DIAGNOSIS — E119 Type 2 diabetes mellitus without complications: Secondary | ICD-10-CM | POA: Diagnosis not present

## 2016-04-17 DIAGNOSIS — E669 Obesity, unspecified: Secondary | ICD-10-CM | POA: Diagnosis not present

## 2016-04-17 DIAGNOSIS — I10 Essential (primary) hypertension: Secondary | ICD-10-CM | POA: Diagnosis not present

## 2016-05-03 DIAGNOSIS — E119 Type 2 diabetes mellitus without complications: Secondary | ICD-10-CM | POA: Diagnosis not present

## 2016-05-03 DIAGNOSIS — H25013 Cortical age-related cataract, bilateral: Secondary | ICD-10-CM | POA: Diagnosis not present

## 2016-05-30 ENCOUNTER — Ambulatory Visit (AMBULATORY_SURGERY_CENTER): Payer: Self-pay

## 2016-05-30 ENCOUNTER — Encounter: Payer: Self-pay | Admitting: Internal Medicine

## 2016-05-30 VITALS — Ht 65.0 in | Wt 185.0 lb

## 2016-05-30 DIAGNOSIS — Z86010 Personal history of colonic polyps: Secondary | ICD-10-CM

## 2016-05-30 MED ORDER — NA SULFATE-K SULFATE-MG SULF 17.5-3.13-1.6 GM/177ML PO SOLN: 1.0000 | Freq: Once | ORAL | 0 refills | Status: AC

## 2016-05-30 NOTE — Progress Notes (Signed)
Denies allergies to eggs or soy products. Denies complication of anesthesia or sedation. Denies use of weight loss medication. Denies use of O2.   Emmi instructions given for colonoscopy.  

## 2016-06-12 ENCOUNTER — Ambulatory Visit (AMBULATORY_SURGERY_CENTER): Payer: Commercial Managed Care - HMO | Admitting: Internal Medicine

## 2016-06-12 ENCOUNTER — Encounter: Payer: Self-pay | Admitting: Internal Medicine

## 2016-06-12 VITALS — BP 129/71 | HR 68 | Temp 98.2°F | Resp 13 | Ht 65.0 in | Wt 185.0 lb

## 2016-06-12 DIAGNOSIS — Z86010 Personal history of colonic polyps: Secondary | ICD-10-CM | POA: Diagnosis present

## 2016-06-12 DIAGNOSIS — I10 Essential (primary) hypertension: Secondary | ICD-10-CM | POA: Diagnosis not present

## 2016-06-12 DIAGNOSIS — Z8371 Family history of colonic polyps: Secondary | ICD-10-CM | POA: Diagnosis not present

## 2016-06-12 DIAGNOSIS — E119 Type 2 diabetes mellitus without complications: Secondary | ICD-10-CM | POA: Diagnosis not present

## 2016-06-12 MED ORDER — SODIUM CHLORIDE 0.9 % IV SOLN: 500.0000 mL | INTRAVENOUS | Status: AC

## 2016-06-12 NOTE — Progress Notes (Signed)
Report to PACU, RN, vss, BBS= Clear.  

## 2016-06-12 NOTE — Op Note (Signed)
[facility] Patient Name: [name] Procedure Date: 06/12/2016 9:17 AM MRN: [id number] Endoscopist: Wilhemina Bonito. Marina Goodell , MD Age: 76 Referring MD:  Date of Birth: 01/24/1940 Gender: Female Account #: [id number] Procedure:                Colonoscopy Indications:              High risk colon cancer surveillance: Personal                            history of non-advanced adenoma. Prior exam March                            2011 with small tubular adenomas Medicines:                Monitored Anesthesia Care Procedure:                Pre-Anesthesia Assessment:                           - Prior to the procedure, a History and Physical                            was performed, and patient medications and                            allergies were reviewed. The patient's tolerance of                            previous anesthesia was also reviewed. The risks                            and benefits of the procedure and the sedation                            options and risks were discussed with the patient.                            All questions were answered, and informed consent                            was obtained. Prior Anticoagulants: The patient has                            taken no previous anticoagulant or antiplatelet                            agents. ASA Grade Assessment: II - A patient with                            mild systemic disease. After reviewing the risks                            and benefits, the patient was deemed in  satisfactory condition to undergo the procedure.                           After obtaining informed consent, the colonoscope                            was passed under direct vision. Throughout the                            procedure, the patient's blood pressure, pulse, and                            oxygen saturations were monitored continuously. The                            Model PCF-[id number])  scope was introduced                            through the anus and advanced to the the cecum,                            identified by appendiceal orifice and ileocecal                            valve. The ileocecal valve, appendiceal orifice,                            and rectum were photographed. The quality of the                            bowel preparation was good. The colonoscopy was                            performed without difficulty. The patient tolerated                            the procedure well. The bowel preparation used was                            SUPREP. Scope In: 9:25:40 AM Scope Out: 9:46:50 AM Scope Withdrawal Time: 0 hours 11 minutes 51 seconds  Total Procedure Duration: 0 hours 21 minutes 10 seconds  Findings:                 Multiple small and large-mouthed diverticula were                            found in the left colon and right colon.                           The exam was otherwise without abnormality on                            direct and retroflexion views. Complications:  No immediate complications. Estimated blood loss:                            None. Estimated Blood Loss:     Estimated blood loss: none. Impression:               - Diverticulosis in the left colon and in the right                            colon.                           - The examination was otherwise normal on direct                            and retroflexion views.                           - No specimens collected. Recommendation:           - Repeat colonoscopy is not recommended for                            surveillance.                           - Patient has a contact number available for                            emergencies. The signs and symptoms of potential                            delayed complications were discussed with the                            patient. Return to normal activities tomorrow.                            Written  discharge instructions were provided to the                            patient.                           - Resume previous diet.                           - Continue present medications. Wilhemina BonitoJohn N. Marina GoodellPerry, MD 06/12/2016 9:50:49 AM This report has been signed electronically.

## 2016-06-12 NOTE — Patient Instructions (Signed)
**  Handout given on Diverticulosis**   YOU HAD AN ENDOSCOPIC PROCEDURE TODAY: Refer to the procedure report and other information in the discharge instructions given to you for any specific questions about what was found during the examination. If this information does not answer your questions, please call [place] office at [phone] to clarify.   YOU SHOULD EXPECT: Some feelings of bloating in the abdomen. Passage of more gas than usual. Walking can help get rid of the air that was put into your GI tract during the procedure and reduce the bloating. If you had a lower endoscopy (such as a colonoscopy or flexible sigmoidoscopy) you may notice spotting of blood in your stool or on the toilet paper. Some abdominal soreness may be present for a day or two, also.  DIET: Your first meal following the procedure should be a light meal and then it is ok to progress to your normal diet. A half-sandwich or bowl of soup is an example of a good first meal. Heavy or fried foods are harder to digest and may make you feel nauseous or bloated. Drink plenty of fluids but you should avoid alcoholic beverages for 24 hours. If you had a esophageal dilation, please see attached instructions for diet.    ACTIVITY: Your care partner should take you home directly after the procedure. You should plan to take it easy, moving slowly for the rest of the day. You can resume normal activity the day after the procedure however YOU SHOULD NOT DRIVE, use power tools, machinery or perform tasks that involve climbing or major physical exertion for 24 hours (because of the sedation medicines used during the test).   SYMPTOMS TO REPORT IMMEDIATELY: A gastroenterologist can be reached at any hour. Please call [phone]  for any of the following symptoms:  Following lower endoscopy (colonoscopy, flexible sigmoidoscopy) Excessive amounts of blood in the stool  Significant tenderness, worsening of abdominal pains  Swelling of the  abdomen that is new, acute  Fever of 100 or higher    FOLLOW UP:  If any biopsies were taken you will be contacted by phone or by letter within the next 1-3 weeks. Call [phone]  if you have not heard about the biopsies in 3 weeks.  Please also call with any specific questions about appointments or follow up tests.  

## 2016-06-13 ENCOUNTER — Telehealth: Payer: Self-pay | Admitting: *Deleted

## 2016-06-13 NOTE — Telephone Encounter (Signed)
  Follow up Call-  Call back number 06/12/2016  Post procedure Call Back phone  # [phone]  Permission to leave phone message Yes  Some recent data might be hidden     Patient questions:  Do you have a fever, pain , or abdominal swelling? No. Pain Score  0 *  Have you tolerated food without any problems? Yes.    Have you been able to return to your normal activities? Yes.    Do you have any questions about your discharge instructions: Diet   No. Medications  No. Follow up visit  No.  Do you have questions or concerns about your Care? No.  Actions: * If pain score is 4 or above: No action needed, pain <4.

## 2016-07-26 DIAGNOSIS — M5442 Lumbago with sciatica, left side: Secondary | ICD-10-CM | POA: Diagnosis not present

## 2016-07-26 DIAGNOSIS — Z6829 Body mass index (BMI) 29.0-29.9, adult: Secondary | ICD-10-CM | POA: Diagnosis not present

## 2016-08-17 DIAGNOSIS — Z136 Encounter for screening for cardiovascular disorders: Secondary | ICD-10-CM | POA: Diagnosis not present

## 2016-08-17 DIAGNOSIS — Z23 Encounter for immunization: Secondary | ICD-10-CM | POA: Diagnosis not present

## 2016-08-17 DIAGNOSIS — Z1389 Encounter for screening for other disorder: Secondary | ICD-10-CM | POA: Diagnosis not present

## 2016-08-17 DIAGNOSIS — Z0000 Encounter for general adult medical examination without abnormal findings: Secondary | ICD-10-CM | POA: Diagnosis not present

## 2016-08-17 DIAGNOSIS — Z6830 Body mass index (BMI) 30.0-30.9, adult: Secondary | ICD-10-CM | POA: Diagnosis not present

## 2016-08-17 DIAGNOSIS — E669 Obesity, unspecified: Secondary | ICD-10-CM | POA: Diagnosis not present

## 2016-08-17 DIAGNOSIS — Z1231 Encounter for screening mammogram for malignant neoplasm of breast: Secondary | ICD-10-CM | POA: Diagnosis not present

## 2016-08-17 DIAGNOSIS — E785 Hyperlipidemia, unspecified: Secondary | ICD-10-CM | POA: Diagnosis not present

## 2016-08-17 DIAGNOSIS — Z9181 History of falling: Secondary | ICD-10-CM | POA: Diagnosis not present

## 2016-08-21 ENCOUNTER — Other Ambulatory Visit: Payer: Self-pay | Admitting: Nurse Practitioner

## 2016-08-21 DIAGNOSIS — Z1231 Encounter for screening mammogram for malignant neoplasm of breast: Secondary | ICD-10-CM

## 2016-08-29 DIAGNOSIS — I10 Essential (primary) hypertension: Secondary | ICD-10-CM | POA: Diagnosis not present

## 2016-08-29 DIAGNOSIS — F329 Major depressive disorder, single episode, unspecified: Secondary | ICD-10-CM | POA: Diagnosis not present

## 2016-08-29 DIAGNOSIS — E782 Mixed hyperlipidemia: Secondary | ICD-10-CM | POA: Diagnosis not present

## 2016-08-29 DIAGNOSIS — E890 Postprocedural hypothyroidism: Secondary | ICD-10-CM | POA: Diagnosis not present

## 2016-08-29 DIAGNOSIS — E119 Type 2 diabetes mellitus without complications: Secondary | ICD-10-CM | POA: Diagnosis not present

## 2016-08-29 DIAGNOSIS — M1712 Unilateral primary osteoarthritis, left knee: Secondary | ICD-10-CM | POA: Diagnosis not present

## 2016-08-29 DIAGNOSIS — Z6829 Body mass index (BMI) 29.0-29.9, adult: Secondary | ICD-10-CM | POA: Diagnosis not present

## 2016-09-18 DIAGNOSIS — M1712 Unilateral primary osteoarthritis, left knee: Secondary | ICD-10-CM | POA: Diagnosis not present

## 2017-01-02 ENCOUNTER — Ambulatory Visit: Payer: Commercial Managed Care - HMO

## 2017-02-22 DIAGNOSIS — I10 Essential (primary) hypertension: Secondary | ICD-10-CM | POA: Diagnosis not present

## 2017-02-22 DIAGNOSIS — E782 Mixed hyperlipidemia: Secondary | ICD-10-CM | POA: Diagnosis not present

## 2017-02-22 DIAGNOSIS — E890 Postprocedural hypothyroidism: Secondary | ICD-10-CM | POA: Diagnosis not present

## 2017-02-22 DIAGNOSIS — Z6829 Body mass index (BMI) 29.0-29.9, adult: Secondary | ICD-10-CM | POA: Diagnosis not present

## 2017-02-22 DIAGNOSIS — Z139 Encounter for screening, unspecified: Secondary | ICD-10-CM | POA: Diagnosis not present

## 2017-02-22 DIAGNOSIS — F329 Major depressive disorder, single episode, unspecified: Secondary | ICD-10-CM | POA: Diagnosis not present

## 2017-02-22 DIAGNOSIS — E669 Obesity, unspecified: Secondary | ICD-10-CM | POA: Diagnosis not present

## 2017-02-22 DIAGNOSIS — E119 Type 2 diabetes mellitus without complications: Secondary | ICD-10-CM | POA: Diagnosis not present

## 2017-05-16 DIAGNOSIS — E119 Type 2 diabetes mellitus without complications: Secondary | ICD-10-CM | POA: Diagnosis not present

## 2017-05-16 DIAGNOSIS — H2513 Age-related nuclear cataract, bilateral: Secondary | ICD-10-CM | POA: Diagnosis not present

## 2017-05-16 DIAGNOSIS — H04123 Dry eye syndrome of bilateral lacrimal glands: Secondary | ICD-10-CM | POA: Diagnosis not present

## 2017-05-16 DIAGNOSIS — E089 Diabetes mellitus due to underlying condition without complications: Secondary | ICD-10-CM | POA: Diagnosis not present

## 2017-05-22 DIAGNOSIS — M2550 Pain in unspecified joint: Secondary | ICD-10-CM | POA: Diagnosis not present

## 2017-05-22 DIAGNOSIS — I10 Essential (primary) hypertension: Secondary | ICD-10-CM | POA: Diagnosis not present

## 2017-05-22 DIAGNOSIS — E669 Obesity, unspecified: Secondary | ICD-10-CM | POA: Diagnosis not present

## 2017-05-22 DIAGNOSIS — E119 Type 2 diabetes mellitus without complications: Secondary | ICD-10-CM | POA: Diagnosis not present

## 2017-05-22 DIAGNOSIS — E890 Postprocedural hypothyroidism: Secondary | ICD-10-CM | POA: Diagnosis not present

## 2017-05-22 DIAGNOSIS — E782 Mixed hyperlipidemia: Secondary | ICD-10-CM | POA: Diagnosis not present

## 2017-05-22 DIAGNOSIS — Z139 Encounter for screening, unspecified: Secondary | ICD-10-CM | POA: Diagnosis not present

## 2017-05-22 DIAGNOSIS — F329 Major depressive disorder, single episode, unspecified: Secondary | ICD-10-CM | POA: Diagnosis not present

## 2017-05-22 DIAGNOSIS — Z6829 Body mass index (BMI) 29.0-29.9, adult: Secondary | ICD-10-CM | POA: Diagnosis not present

## 2017-05-31 DIAGNOSIS — E119 Type 2 diabetes mellitus without complications: Secondary | ICD-10-CM | POA: Diagnosis not present

## 2017-05-31 DIAGNOSIS — H04123 Dry eye syndrome of bilateral lacrimal glands: Secondary | ICD-10-CM | POA: Diagnosis not present

## 2017-05-31 DIAGNOSIS — E089 Diabetes mellitus due to underlying condition without complications: Secondary | ICD-10-CM | POA: Diagnosis not present

## 2017-05-31 DIAGNOSIS — H2513 Age-related nuclear cataract, bilateral: Secondary | ICD-10-CM | POA: Diagnosis not present

## 2017-08-19 DIAGNOSIS — Z139 Encounter for screening, unspecified: Secondary | ICD-10-CM | POA: Diagnosis not present

## 2017-08-19 DIAGNOSIS — E785 Hyperlipidemia, unspecified: Secondary | ICD-10-CM | POA: Diagnosis not present

## 2017-08-19 DIAGNOSIS — Z1331 Encounter for screening for depression: Secondary | ICD-10-CM | POA: Diagnosis not present

## 2017-08-19 DIAGNOSIS — Z1339 Encounter for screening examination for other mental health and behavioral disorders: Secondary | ICD-10-CM | POA: Diagnosis not present

## 2017-08-19 DIAGNOSIS — Z136 Encounter for screening for cardiovascular disorders: Secondary | ICD-10-CM | POA: Diagnosis not present

## 2017-08-19 DIAGNOSIS — Z0000 Encounter for general adult medical examination without abnormal findings: Secondary | ICD-10-CM | POA: Diagnosis not present

## 2017-08-19 DIAGNOSIS — Z9181 History of falling: Secondary | ICD-10-CM | POA: Diagnosis not present

## 2017-09-23 DIAGNOSIS — Z1231 Encounter for screening mammogram for malignant neoplasm of breast: Secondary | ICD-10-CM | POA: Diagnosis not present

## 2017-09-23 DIAGNOSIS — F329 Major depressive disorder, single episode, unspecified: Secondary | ICD-10-CM | POA: Diagnosis not present

## 2017-09-23 DIAGNOSIS — I10 Essential (primary) hypertension: Secondary | ICD-10-CM | POA: Diagnosis not present

## 2017-09-23 DIAGNOSIS — E669 Obesity, unspecified: Secondary | ICD-10-CM | POA: Diagnosis not present

## 2017-09-23 DIAGNOSIS — E890 Postprocedural hypothyroidism: Secondary | ICD-10-CM | POA: Diagnosis not present

## 2017-09-23 DIAGNOSIS — Z6829 Body mass index (BMI) 29.0-29.9, adult: Secondary | ICD-10-CM | POA: Diagnosis not present

## 2017-09-23 DIAGNOSIS — E782 Mixed hyperlipidemia: Secondary | ICD-10-CM | POA: Diagnosis not present

## 2017-09-23 DIAGNOSIS — E119 Type 2 diabetes mellitus without complications: Secondary | ICD-10-CM | POA: Diagnosis not present

## 2017-09-23 DIAGNOSIS — Z1331 Encounter for screening for depression: Secondary | ICD-10-CM | POA: Diagnosis not present

## 2017-10-22 DIAGNOSIS — J309 Allergic rhinitis, unspecified: Secondary | ICD-10-CM | POA: Diagnosis not present

## 2017-10-22 DIAGNOSIS — I10 Essential (primary) hypertension: Secondary | ICD-10-CM | POA: Diagnosis not present

## 2017-10-22 DIAGNOSIS — Z6830 Body mass index (BMI) 30.0-30.9, adult: Secondary | ICD-10-CM | POA: Diagnosis not present

## 2017-10-22 DIAGNOSIS — H9313 Tinnitus, bilateral: Secondary | ICD-10-CM | POA: Diagnosis not present

## 2017-11-01 DIAGNOSIS — Z6830 Body mass index (BMI) 30.0-30.9, adult: Secondary | ICD-10-CM | POA: Diagnosis not present

## 2017-11-01 DIAGNOSIS — M5442 Lumbago with sciatica, left side: Secondary | ICD-10-CM | POA: Diagnosis not present

## 2018-01-22 ENCOUNTER — Other Ambulatory Visit: Payer: Self-pay | Admitting: Nurse Practitioner

## 2018-01-22 DIAGNOSIS — Z1231 Encounter for screening mammogram for malignant neoplasm of breast: Secondary | ICD-10-CM

## 2018-01-22 DIAGNOSIS — E2839 Other primary ovarian failure: Secondary | ICD-10-CM

## 2018-01-23 DIAGNOSIS — Z6829 Body mass index (BMI) 29.0-29.9, adult: Secondary | ICD-10-CM | POA: Diagnosis not present

## 2018-01-23 DIAGNOSIS — E119 Type 2 diabetes mellitus without complications: Secondary | ICD-10-CM | POA: Diagnosis not present

## 2018-01-23 DIAGNOSIS — E669 Obesity, unspecified: Secondary | ICD-10-CM | POA: Diagnosis not present

## 2018-01-23 DIAGNOSIS — E890 Postprocedural hypothyroidism: Secondary | ICD-10-CM | POA: Diagnosis not present

## 2018-01-23 DIAGNOSIS — F329 Major depressive disorder, single episode, unspecified: Secondary | ICD-10-CM | POA: Diagnosis not present

## 2018-01-23 DIAGNOSIS — M25561 Pain in right knee: Secondary | ICD-10-CM | POA: Diagnosis not present

## 2018-01-23 DIAGNOSIS — E782 Mixed hyperlipidemia: Secondary | ICD-10-CM | POA: Diagnosis not present

## 2018-01-23 DIAGNOSIS — I10 Essential (primary) hypertension: Secondary | ICD-10-CM | POA: Diagnosis not present

## 2018-02-24 DIAGNOSIS — M109 Gout, unspecified: Secondary | ICD-10-CM | POA: Diagnosis not present

## 2018-03-12 ENCOUNTER — Ambulatory Visit
Admission: RE | Admit: 2018-03-12 | Discharge: 2018-03-12 | Disposition: A | Discharge status: Home / Self Care | Payer: Medicare HMO | Source: Ambulatory Visit | Attending: Nurse Practitioner | Admitting: Nurse Practitioner

## 2018-03-12 DIAGNOSIS — Z1231 Encounter for screening mammogram for malignant neoplasm of breast: Secondary | ICD-10-CM

## 2018-03-12 DIAGNOSIS — Z780 Asymptomatic menopausal state: Secondary | ICD-10-CM | POA: Diagnosis not present

## 2018-03-12 DIAGNOSIS — Z13820 Encounter for screening for osteoporosis: Secondary | ICD-10-CM | POA: Diagnosis not present

## 2018-03-12 DIAGNOSIS — E2839 Other primary ovarian failure: Secondary | ICD-10-CM

## 2018-05-29 DIAGNOSIS — E782 Mixed hyperlipidemia: Secondary | ICD-10-CM | POA: Diagnosis not present

## 2018-05-29 DIAGNOSIS — E890 Postprocedural hypothyroidism: Secondary | ICD-10-CM | POA: Diagnosis not present

## 2018-05-29 DIAGNOSIS — I10 Essential (primary) hypertension: Secondary | ICD-10-CM | POA: Diagnosis not present

## 2018-05-29 DIAGNOSIS — M25561 Pain in right knee: Secondary | ICD-10-CM | POA: Diagnosis not present

## 2018-05-29 DIAGNOSIS — Z6829 Body mass index (BMI) 29.0-29.9, adult: Secondary | ICD-10-CM | POA: Diagnosis not present

## 2018-05-29 DIAGNOSIS — E669 Obesity, unspecified: Secondary | ICD-10-CM | POA: Diagnosis not present

## 2018-05-29 DIAGNOSIS — E119 Type 2 diabetes mellitus without complications: Secondary | ICD-10-CM | POA: Diagnosis not present

## 2018-07-14 DIAGNOSIS — H25813 Combined forms of age-related cataract, bilateral: Secondary | ICD-10-CM | POA: Diagnosis not present

## 2018-07-14 DIAGNOSIS — E119 Type 2 diabetes mellitus without complications: Secondary | ICD-10-CM | POA: Diagnosis not present

## 2018-09-08 DIAGNOSIS — Z139 Encounter for screening, unspecified: Secondary | ICD-10-CM | POA: Diagnosis not present

## 2018-09-08 DIAGNOSIS — Z1331 Encounter for screening for depression: Secondary | ICD-10-CM | POA: Diagnosis not present

## 2018-09-08 DIAGNOSIS — E785 Hyperlipidemia, unspecified: Secondary | ICD-10-CM | POA: Diagnosis not present

## 2018-09-08 DIAGNOSIS — Z0000 Encounter for general adult medical examination without abnormal findings: Secondary | ICD-10-CM | POA: Diagnosis not present

## 2018-09-08 DIAGNOSIS — Z9181 History of falling: Secondary | ICD-10-CM | POA: Diagnosis not present

## 2018-10-17 DIAGNOSIS — Z23 Encounter for immunization: Secondary | ICD-10-CM | POA: Diagnosis not present

## 2018-11-05 DIAGNOSIS — I10 Essential (primary) hypertension: Secondary | ICD-10-CM | POA: Diagnosis not present

## 2018-11-05 DIAGNOSIS — E119 Type 2 diabetes mellitus without complications: Secondary | ICD-10-CM | POA: Diagnosis not present

## 2018-11-05 DIAGNOSIS — E890 Postprocedural hypothyroidism: Secondary | ICD-10-CM | POA: Diagnosis not present

## 2018-11-05 DIAGNOSIS — E782 Mixed hyperlipidemia: Secondary | ICD-10-CM | POA: Diagnosis not present

## 2018-11-05 DIAGNOSIS — M79642 Pain in left hand: Secondary | ICD-10-CM | POA: Diagnosis not present

## 2018-11-05 DIAGNOSIS — M109 Gout, unspecified: Secondary | ICD-10-CM | POA: Diagnosis not present

## 2018-11-05 DIAGNOSIS — M79641 Pain in right hand: Secondary | ICD-10-CM | POA: Diagnosis not present

## 2018-11-05 DIAGNOSIS — R634 Abnormal weight loss: Secondary | ICD-10-CM | POA: Diagnosis not present

## 2018-11-26 DIAGNOSIS — E876 Hypokalemia: Secondary | ICD-10-CM | POA: Diagnosis not present

## 2018-12-01 DIAGNOSIS — M25561 Pain in right knee: Secondary | ICD-10-CM | POA: Diagnosis not present

## 2018-12-01 DIAGNOSIS — M2550 Pain in unspecified joint: Secondary | ICD-10-CM | POA: Diagnosis not present

## 2018-12-01 DIAGNOSIS — M064 Inflammatory polyarthropathy: Secondary | ICD-10-CM | POA: Diagnosis not present

## 2018-12-01 DIAGNOSIS — M79643 Pain in unspecified hand: Secondary | ICD-10-CM | POA: Diagnosis not present

## 2018-12-01 DIAGNOSIS — M25562 Pain in left knee: Secondary | ICD-10-CM | POA: Diagnosis not present

## 2018-12-01 DIAGNOSIS — M79642 Pain in left hand: Secondary | ICD-10-CM | POA: Diagnosis not present

## 2018-12-01 DIAGNOSIS — M1990 Unspecified osteoarthritis, unspecified site: Secondary | ICD-10-CM | POA: Diagnosis not present

## 2018-12-01 DIAGNOSIS — M7989 Other specified soft tissue disorders: Secondary | ICD-10-CM | POA: Diagnosis not present

## 2018-12-01 DIAGNOSIS — E1169 Type 2 diabetes mellitus with other specified complication: Secondary | ICD-10-CM | POA: Diagnosis not present

## 2018-12-01 DIAGNOSIS — M79641 Pain in right hand: Secondary | ICD-10-CM | POA: Diagnosis not present

## 2018-12-01 DIAGNOSIS — M0609 Rheumatoid arthritis without rheumatoid factor, multiple sites: Secondary | ICD-10-CM | POA: Diagnosis not present

## 2018-12-01 DIAGNOSIS — E785 Hyperlipidemia, unspecified: Secondary | ICD-10-CM | POA: Diagnosis not present

## 2018-12-01 DIAGNOSIS — M79671 Pain in right foot: Secondary | ICD-10-CM | POA: Diagnosis not present

## 2018-12-01 DIAGNOSIS — M79672 Pain in left foot: Secondary | ICD-10-CM | POA: Diagnosis not present

## 2018-12-01 DIAGNOSIS — M549 Dorsalgia, unspecified: Secondary | ICD-10-CM | POA: Diagnosis not present

## 2018-12-30 DIAGNOSIS — E1169 Type 2 diabetes mellitus with other specified complication: Secondary | ICD-10-CM | POA: Diagnosis not present

## 2018-12-30 DIAGNOSIS — M7989 Other specified soft tissue disorders: Secondary | ICD-10-CM | POA: Diagnosis not present

## 2018-12-30 DIAGNOSIS — E785 Hyperlipidemia, unspecified: Secondary | ICD-10-CM | POA: Diagnosis not present

## 2018-12-30 DIAGNOSIS — Z79899 Other long term (current) drug therapy: Secondary | ICD-10-CM | POA: Diagnosis not present

## 2018-12-30 DIAGNOSIS — M0609 Rheumatoid arthritis without rheumatoid factor, multiple sites: Secondary | ICD-10-CM | POA: Diagnosis not present

## 2018-12-30 DIAGNOSIS — M549 Dorsalgia, unspecified: Secondary | ICD-10-CM | POA: Diagnosis not present

## 2018-12-30 DIAGNOSIS — M79643 Pain in unspecified hand: Secondary | ICD-10-CM | POA: Diagnosis not present

## 2018-12-30 DIAGNOSIS — M1990 Unspecified osteoarthritis, unspecified site: Secondary | ICD-10-CM | POA: Diagnosis not present

## 2019-01-12 DIAGNOSIS — M0600 Rheumatoid arthritis without rheumatoid factor, unspecified site: Secondary | ICD-10-CM | POA: Diagnosis not present

## 2019-01-12 DIAGNOSIS — E119 Type 2 diabetes mellitus without complications: Secondary | ICD-10-CM | POA: Diagnosis not present

## 2019-01-12 DIAGNOSIS — R3915 Urgency of urination: Secondary | ICD-10-CM | POA: Diagnosis not present

## 2019-01-12 DIAGNOSIS — Z6828 Body mass index (BMI) 28.0-28.9, adult: Secondary | ICD-10-CM | POA: Diagnosis not present

## 2019-01-12 DIAGNOSIS — M545 Low back pain: Secondary | ICD-10-CM | POA: Diagnosis not present

## 2019-02-10 DIAGNOSIS — M79643 Pain in unspecified hand: Secondary | ICD-10-CM | POA: Diagnosis not present

## 2019-02-10 DIAGNOSIS — M0609 Rheumatoid arthritis without rheumatoid factor, multiple sites: Secondary | ICD-10-CM | POA: Diagnosis not present

## 2019-02-10 DIAGNOSIS — M7989 Other specified soft tissue disorders: Secondary | ICD-10-CM | POA: Diagnosis not present

## 2019-02-10 DIAGNOSIS — M549 Dorsalgia, unspecified: Secondary | ICD-10-CM | POA: Diagnosis not present

## 2019-02-10 DIAGNOSIS — E1169 Type 2 diabetes mellitus with other specified complication: Secondary | ICD-10-CM | POA: Diagnosis not present

## 2019-02-10 DIAGNOSIS — Z79899 Other long term (current) drug therapy: Secondary | ICD-10-CM | POA: Diagnosis not present

## 2019-02-10 DIAGNOSIS — E785 Hyperlipidemia, unspecified: Secondary | ICD-10-CM | POA: Diagnosis not present

## 2019-02-10 DIAGNOSIS — M1990 Unspecified osteoarthritis, unspecified site: Secondary | ICD-10-CM | POA: Diagnosis not present

## 2019-02-25 DIAGNOSIS — Z6828 Body mass index (BMI) 28.0-28.9, adult: Secondary | ICD-10-CM | POA: Diagnosis not present

## 2019-02-25 DIAGNOSIS — R3989 Other symptoms and signs involving the genitourinary system: Secondary | ICD-10-CM | POA: Diagnosis not present

## 2019-02-25 DIAGNOSIS — K209 Esophagitis, unspecified without bleeding: Secondary | ICD-10-CM | POA: Diagnosis not present

## 2019-03-20 DIAGNOSIS — E119 Type 2 diabetes mellitus without complications: Secondary | ICD-10-CM | POA: Diagnosis not present

## 2019-03-20 DIAGNOSIS — E890 Postprocedural hypothyroidism: Secondary | ICD-10-CM | POA: Diagnosis not present

## 2019-03-20 DIAGNOSIS — I10 Essential (primary) hypertension: Secondary | ICD-10-CM | POA: Diagnosis not present

## 2019-03-20 DIAGNOSIS — E782 Mixed hyperlipidemia: Secondary | ICD-10-CM | POA: Diagnosis not present

## 2019-03-20 DIAGNOSIS — M109 Gout, unspecified: Secondary | ICD-10-CM | POA: Diagnosis not present

## 2019-03-24 DIAGNOSIS — M1990 Unspecified osteoarthritis, unspecified site: Secondary | ICD-10-CM | POA: Diagnosis not present

## 2019-03-24 DIAGNOSIS — M0609 Rheumatoid arthritis without rheumatoid factor, multiple sites: Secondary | ICD-10-CM | POA: Diagnosis not present

## 2019-03-24 DIAGNOSIS — Z79899 Other long term (current) drug therapy: Secondary | ICD-10-CM | POA: Diagnosis not present

## 2019-03-24 DIAGNOSIS — E1169 Type 2 diabetes mellitus with other specified complication: Secondary | ICD-10-CM | POA: Diagnosis not present

## 2019-04-14 DIAGNOSIS — M0609 Rheumatoid arthritis without rheumatoid factor, multiple sites: Secondary | ICD-10-CM | POA: Diagnosis not present

## 2019-04-21 DIAGNOSIS — M5442 Lumbago with sciatica, left side: Secondary | ICD-10-CM | POA: Diagnosis not present

## 2019-04-22 ENCOUNTER — Encounter: Payer: Self-pay | Admitting: Family Medicine

## 2019-04-28 ENCOUNTER — Encounter: Payer: Self-pay | Admitting: Family Medicine

## 2019-06-15 DIAGNOSIS — M0609 Rheumatoid arthritis without rheumatoid factor, multiple sites: Secondary | ICD-10-CM | POA: Diagnosis not present

## 2019-06-15 DIAGNOSIS — E1169 Type 2 diabetes mellitus with other specified complication: Secondary | ICD-10-CM | POA: Diagnosis not present

## 2019-06-15 DIAGNOSIS — Z79899 Other long term (current) drug therapy: Secondary | ICD-10-CM | POA: Diagnosis not present

## 2019-06-15 DIAGNOSIS — M1990 Unspecified osteoarthritis, unspecified site: Secondary | ICD-10-CM | POA: Diagnosis not present

## 2019-07-21 DIAGNOSIS — E1169 Type 2 diabetes mellitus with other specified complication: Secondary | ICD-10-CM | POA: Diagnosis not present

## 2019-07-21 DIAGNOSIS — E890 Postprocedural hypothyroidism: Secondary | ICD-10-CM | POA: Diagnosis not present

## 2019-07-21 DIAGNOSIS — E782 Mixed hyperlipidemia: Secondary | ICD-10-CM | POA: Diagnosis not present

## 2019-07-21 DIAGNOSIS — E785 Hyperlipidemia, unspecified: Secondary | ICD-10-CM | POA: Diagnosis not present

## 2019-07-21 DIAGNOSIS — I10 Essential (primary) hypertension: Secondary | ICD-10-CM | POA: Diagnosis not present

## 2019-07-21 DIAGNOSIS — M109 Gout, unspecified: Secondary | ICD-10-CM | POA: Diagnosis not present

## 2019-07-28 DIAGNOSIS — E119 Type 2 diabetes mellitus without complications: Secondary | ICD-10-CM | POA: Diagnosis not present

## 2019-07-28 DIAGNOSIS — H04123 Dry eye syndrome of bilateral lacrimal glands: Secondary | ICD-10-CM | POA: Diagnosis not present

## 2019-07-28 DIAGNOSIS — H25813 Combined forms of age-related cataract, bilateral: Secondary | ICD-10-CM | POA: Diagnosis not present

## 2019-09-15 DIAGNOSIS — Z79899 Other long term (current) drug therapy: Secondary | ICD-10-CM | POA: Diagnosis not present

## 2019-09-15 DIAGNOSIS — M0609 Rheumatoid arthritis without rheumatoid factor, multiple sites: Secondary | ICD-10-CM | POA: Diagnosis not present

## 2019-09-15 DIAGNOSIS — E1169 Type 2 diabetes mellitus with other specified complication: Secondary | ICD-10-CM | POA: Diagnosis not present

## 2019-09-15 DIAGNOSIS — M1990 Unspecified osteoarthritis, unspecified site: Secondary | ICD-10-CM | POA: Diagnosis not present

## 2019-10-22 DIAGNOSIS — I10 Essential (primary) hypertension: Secondary | ICD-10-CM | POA: Diagnosis not present

## 2019-10-22 DIAGNOSIS — E782 Mixed hyperlipidemia: Secondary | ICD-10-CM | POA: Diagnosis not present

## 2019-10-22 DIAGNOSIS — E890 Postprocedural hypothyroidism: Secondary | ICD-10-CM | POA: Diagnosis not present

## 2019-10-22 DIAGNOSIS — Z23 Encounter for immunization: Secondary | ICD-10-CM | POA: Diagnosis not present

## 2019-10-22 DIAGNOSIS — M109 Gout, unspecified: Secondary | ICD-10-CM | POA: Diagnosis not present

## 2019-10-22 DIAGNOSIS — E785 Hyperlipidemia, unspecified: Secondary | ICD-10-CM | POA: Diagnosis not present

## 2019-10-22 DIAGNOSIS — E1169 Type 2 diabetes mellitus with other specified complication: Secondary | ICD-10-CM | POA: Diagnosis not present

## 2019-10-22 DIAGNOSIS — Z9181 History of falling: Secondary | ICD-10-CM | POA: Diagnosis not present

## 2019-10-22 DIAGNOSIS — Z139 Encounter for screening, unspecified: Secondary | ICD-10-CM | POA: Diagnosis not present

## 2019-12-15 DIAGNOSIS — M1990 Unspecified osteoarthritis, unspecified site: Secondary | ICD-10-CM | POA: Diagnosis not present

## 2019-12-15 DIAGNOSIS — E1169 Type 2 diabetes mellitus with other specified complication: Secondary | ICD-10-CM | POA: Diagnosis not present

## 2019-12-15 DIAGNOSIS — Z79899 Other long term (current) drug therapy: Secondary | ICD-10-CM | POA: Diagnosis not present

## 2019-12-15 DIAGNOSIS — M0609 Rheumatoid arthritis without rheumatoid factor, multiple sites: Secondary | ICD-10-CM | POA: Diagnosis not present

## 2020-01-22 DIAGNOSIS — M109 Gout, unspecified: Secondary | ICD-10-CM | POA: Diagnosis not present

## 2020-01-22 DIAGNOSIS — E782 Mixed hyperlipidemia: Secondary | ICD-10-CM | POA: Diagnosis not present

## 2020-01-22 DIAGNOSIS — E785 Hyperlipidemia, unspecified: Secondary | ICD-10-CM | POA: Diagnosis not present

## 2020-01-22 DIAGNOSIS — I10 Essential (primary) hypertension: Secondary | ICD-10-CM | POA: Diagnosis not present

## 2020-01-22 DIAGNOSIS — Z6825 Body mass index (BMI) 25.0-25.9, adult: Secondary | ICD-10-CM | POA: Diagnosis not present

## 2020-01-22 DIAGNOSIS — R634 Abnormal weight loss: Secondary | ICD-10-CM | POA: Diagnosis not present

## 2020-01-22 DIAGNOSIS — E1169 Type 2 diabetes mellitus with other specified complication: Secondary | ICD-10-CM | POA: Diagnosis not present

## 2020-01-22 DIAGNOSIS — E890 Postprocedural hypothyroidism: Secondary | ICD-10-CM | POA: Diagnosis not present

## 2020-02-26 ENCOUNTER — Other Ambulatory Visit: Payer: Self-pay | Admitting: Nurse Practitioner

## 2020-02-26 DIAGNOSIS — Z1231 Encounter for screening mammogram for malignant neoplasm of breast: Secondary | ICD-10-CM

## 2020-03-15 DIAGNOSIS — E785 Hyperlipidemia, unspecified: Secondary | ICD-10-CM | POA: Diagnosis not present

## 2020-03-15 DIAGNOSIS — E1169 Type 2 diabetes mellitus with other specified complication: Secondary | ICD-10-CM | POA: Diagnosis not present

## 2020-03-15 DIAGNOSIS — M1990 Unspecified osteoarthritis, unspecified site: Secondary | ICD-10-CM | POA: Diagnosis not present

## 2020-03-15 DIAGNOSIS — M0609 Rheumatoid arthritis without rheumatoid factor, multiple sites: Secondary | ICD-10-CM | POA: Diagnosis not present

## 2020-03-15 DIAGNOSIS — Z79899 Other long term (current) drug therapy: Secondary | ICD-10-CM | POA: Diagnosis not present

## 2020-03-15 DIAGNOSIS — M79643 Pain in unspecified hand: Secondary | ICD-10-CM | POA: Diagnosis not present

## 2020-04-18 ENCOUNTER — Inpatient Hospital Stay: Admission: RE | Admit: 2020-04-18 | Payer: Medicare HMO | Source: Ambulatory Visit

## 2020-04-25 ENCOUNTER — Encounter: Payer: Self-pay | Admitting: Nurse Practitioner

## 2020-04-25 DIAGNOSIS — K921 Melena: Secondary | ICD-10-CM | POA: Diagnosis not present

## 2020-04-25 DIAGNOSIS — E782 Mixed hyperlipidemia: Secondary | ICD-10-CM | POA: Diagnosis not present

## 2020-04-25 DIAGNOSIS — R634 Abnormal weight loss: Secondary | ICD-10-CM | POA: Diagnosis not present

## 2020-04-25 DIAGNOSIS — M0600 Rheumatoid arthritis without rheumatoid factor, unspecified site: Secondary | ICD-10-CM | POA: Diagnosis not present

## 2020-04-25 DIAGNOSIS — M109 Gout, unspecified: Secondary | ICD-10-CM | POA: Diagnosis not present

## 2020-04-25 DIAGNOSIS — I10 Essential (primary) hypertension: Secondary | ICD-10-CM | POA: Diagnosis not present

## 2020-04-25 DIAGNOSIS — E1169 Type 2 diabetes mellitus with other specified complication: Secondary | ICD-10-CM | POA: Diagnosis not present

## 2020-04-25 DIAGNOSIS — E890 Postprocedural hypothyroidism: Secondary | ICD-10-CM | POA: Diagnosis not present

## 2020-04-25 DIAGNOSIS — E785 Hyperlipidemia, unspecified: Secondary | ICD-10-CM | POA: Diagnosis not present

## 2020-04-27 DIAGNOSIS — I700 Atherosclerosis of aorta: Secondary | ICD-10-CM | POA: Diagnosis not present

## 2020-04-27 DIAGNOSIS — K5730 Diverticulosis of large intestine without perforation or abscess without bleeding: Secondary | ICD-10-CM | POA: Diagnosis not present

## 2020-04-27 DIAGNOSIS — R634 Abnormal weight loss: Secondary | ICD-10-CM | POA: Diagnosis not present

## 2020-04-27 DIAGNOSIS — N281 Cyst of kidney, acquired: Secondary | ICD-10-CM | POA: Diagnosis not present

## 2020-05-04 ENCOUNTER — Encounter: Payer: Self-pay | Admitting: Nurse Practitioner

## 2020-05-04 ENCOUNTER — Ambulatory Visit: Payer: Medicare HMO | Admitting: Nurse Practitioner

## 2020-05-04 VITALS — BP 130/74 | HR 58 | Ht 65.0 in | Wt 150.4 lb

## 2020-05-04 DIAGNOSIS — R634 Abnormal weight loss: Secondary | ICD-10-CM

## 2020-05-04 DIAGNOSIS — K5900 Constipation, unspecified: Secondary | ICD-10-CM

## 2020-05-04 DIAGNOSIS — Z8719 Personal history of other diseases of the digestive system: Secondary | ICD-10-CM | POA: Diagnosis not present

## 2020-05-04 DIAGNOSIS — R1310 Dysphagia, unspecified: Secondary | ICD-10-CM | POA: Diagnosis not present

## 2020-05-04 NOTE — Patient Instructions (Signed)
Take Benefiber daily for your constipation.  You have been scheduled for an endoscopy. Please follow written instructions given to you at your visit today. If you use inhalers (even only as needed), please bring them with you on the day of your procedure.

## 2020-05-04 NOTE — Progress Notes (Signed)
ASSESSMENT AND PLAN      # 80 yo female referred for unintentional weight loss of 32 pounds, she thinks since June 2021.  She has no nausea nor vomiting.  She is up to date on colonoscopy ( 2018).  She has chronic dysphagia which will be addressed below but unlikely to be the cause of weight loss.  CT scan of the abdomen and pelvis with IV contrast through [facility]) a few days ago was unrevealing.  I do not have the recent lab work drawn by PCP but patient says her thyroid hormone dosage was just reduced for the second time this year.  Without new GI complaints ( or worsening of her chronic ones) I don't have an explanation for the weight loss.  Certainly hyperthyroid state could be a contributing factor. She also mentions that she is depressed ( lost husband).  -- Patient says her weight has been stable over the last couple of weeks.  I have asked her to weigh herself with the same scale every morning and keep track of her weight.  -- I will request recent labs by PCP -- Further evaluation at time of EGD which will be done for dysphagia, see below.    ADDENDUM:  At the close of our visit I received labs from PCP drawn 04/26/2020.  Renal function normal, liver chemistries normal.  Hemoglobin A1c 6.  CBC normal.  TSH low at 0.084.  We will scan labs and CT scan into epic  # Chronic solid food dysphagia with remote history of esophageal strictures   --Advised patient to eat small bites, chew well with liquids in between bites to avoid food impaction. -- We will schedule patient for EGD for further evaluation of dysphagia and possible esophageal dilation. The risks and benefits of EGD with possible biopsies was discussed with the patient and they agree to proceed.   # Constipation, started after discontinuation of Metformin a few months back. --Continue with good hydration ( at leastr 60 oz of water daily)  --Start daily Benefiber --Call me in 4 weeks. If constipation not improved  then will trying Miralax  # History of adenomatous colon polyps. No cancers or polyps on last surveillance colonoscopy June 2018   HISTORY OF PRESENT ILLNESS     Chief Complaint : weight loss  [name] is a 80 y.o. female known remotely to Dr. Marina Goodell with a past medical history significant for adenomatous colon polyps, diverticulosis, esophageal stricture, anxiety, depression, DM, arthritis, glaucoma , hypothyroidism, hyperlipidemia, hypertension, RA on methotrexate, hysterectomy, cholecystectomy, appendectomy.   Patient last seen at time of polyp surveillance colonoscopy in 2018. She is referred by PCP for evaluation of unintentional weight loss. [name] is 150 pounds today, says she was 182 pounds in June 2021. She didn't pay any attention to the weight loss so cannot say how gradual or rapid the  loss was.  She endorses a poor appetite and says she has struggled with depression since the loss of husband to whom she was married for > 40 years.  Patient has no nausea / vomiting. She cannot attribute weight loss to any new medications.   However, her Synthroid dose has been lowered twice this year, the last time being two weeks ago. She hasn't lost any additional weight since dose was lowered  Recent CT scan w/ contrast was unrevealing. She apparently had several labs drawn but I don't have those results.   [name] endorses intermittent generalized lower abdominal  pain but says that is more of a chronic problem. She also mentions that she has chronic solid food dysphagia. She has significant discomfort when a bolus of food temporarily lodges in her esophagus. She has a history of an esophageal stricture requiring dilation, last time in 2011. She complains of excessive dry mouth. Biotene helped initially but no such much anymore. PCP lowered HCTZ dose and this has helped with the dry mouth but still has dysphagia.    Patient endorses constipation. She took Metformin for years and it gave  her runny stools. Metformin was discontinued a few months back ( based on A1c she thinks) and now her stools are hard.  No blood in stool. She drinks a lot of water but hasn't tried any medication for constipation.   PREVIOUS EVALUATIONS:    May 2011 A 16mm stricture was found in the distal esophagus /sp dilation to 18 mm. Otherwise the examination was unremarkable to D2.    Retroflexed views revealed a small hiatal hernia.   June 2018 polyp surveillance colonoscopy --Complete exam, good prep --Diverticulosis   04/28/20 CT scan  abd/ pelvis with contrast  --Cholecystectomy.  Mild intrahepatic biliary duct dilation, no change.  Healed granulomatous disease.  Mild bilateral renal scarring.  Diverticulosis.  Past Medical History:  Diagnosis Date  . Abnormal cholesterol test    elevated  . Allergy   . Anemia   . Angina   . Anxiety   . Arthritis   . Blood transfusion    with hysterectomy mid 1980s  . Cataract   . Depression   . Diabetes mellitus   . Diverticulosis   . GERD (gastroesophageal reflux disease)   . Glaucoma   . H/O hiatal hernia   . Headache(784.0)   . Hyperlipidemia   . Hypertension   . Hypothyroidism   . Kidney lesion   . Pneumonia    walking pneumonia twice -10 yrs ago  . Urine blood    hx of-no diagnosis as to why     Past Surgical History:  Procedure Laterality Date  . ABDOMINAL HYSTERECTOMY    . ANTERIOR CERVICAL CORPECTOMY  02/23/2011   Procedure: ANTERIOR CERVICAL CORPECTOMY;  Surgeon: Carmela Hurt, MD;  Location: [facility];  Service: Neurosurgery;  Laterality: N/A;  Cervical four and Cervical five Corpectomy; Anterior Cervical Fusion Cervical three-six With Plating   . APPENDECTOMY    . CHOLECYSTECTOMY    . KIDNEY SURGERY     lesion removed   . POSTERIOR CERVICAL FUSION/FORAMINOTOMY  02/28/2011   Procedure: POSTERIOR CERVICAL FUSION/FORAMINOTOMY LEVEL 3;  Surgeon: Carmela Hurt, MD;  Location: [facility];  Service: Neurosurgery;   Laterality: N/A;  Cervical three thru six posterior cervical fusion  . THYROIDECTOMY     Family History  Problem Relation Age of Onset  . Colon cancer Neg Hx   . Esophageal cancer Neg Hx   . Rectal cancer Neg Hx   . Stomach cancer Neg Hx    Social History   Tobacco Use  . Smoking status: Never Smoker  . Smokeless tobacco: Never Used  Vaping Use  . Vaping Use: Never used  Substance Use Topics  . Alcohol use: No  . Drug use: No   Current Outpatient Medications  Medication Sig Dispense Refill  . amLODipine (NORVASC) 5 MG tablet Take 5 mg by mouth daily.    . carvedilol (COREG) 3.125 MG tablet Take 3.125 mg by mouth daily.    . folic acid (FOLVITE) 1 MG  tablet Take 1 tablet by mouth daily.    . hydrochlorothiazide (HYDRODIURIL) 12.5 MG tablet Take 12.5 mg by mouth daily.    Marland Kitchen levothyroxine (SYNTHROID) 75 MCG tablet Take 75 mcg by mouth daily before breakfast.    . losartan (COZAAR) 50 MG tablet Take 50 mg by mouth daily.    Marland Kitchen lovastatin (MEVACOR) 40 MG tablet Take 40 mg by mouth at bedtime.    . methotrexate 2.5 MG tablet Take 2.5 mg by mouth once a week. Caution:Chemotherapy. Protect from light.    . Multiple Vitamin (MULITIVITAMIN WITH MINERALS) TABS Take 1 tablet by mouth daily.     Current Facility-Administered Medications  Medication Dose Route Frequency Provider Last Rate Last Admin  . 0.9 %  sodium chloride infusion  500 mL Intravenous Continuous Hilarie Fredrickson, MD       Allergies  Allergen Reactions  . Codeine Other (See Comments)    agitation  . Latex Itching  . Aspirin Other (See Comments)    ulcer  . Bee Venom Swelling  . Cymbalta [Duloxetine Hcl] Other (See Comments)    Ulcer in mouth  . Procaine Hcl Other (See Comments)    agitation  . Shrimp [Shellfish Allergy] Swelling  . Sulfonamide Derivatives Swelling     Review of Systems: Positive for arthritis, vision changes, depression, excessive urination.  All other systems reviewed and negative except where  noted in HPI.   PHYSICAL EXAM :    Wt Readings from Last 3 Encounters:  05/04/20 150 lb 6.4 oz (68.2 kg)  06/12/16 185 lb (83.9 kg)  05/30/16 185 lb (83.9 kg)    BP 130/74   Pulse (!) 58   Ht 5\' 5"  (1.651 m)   Wt 150 lb 6.4 oz (68.2 kg)   SpO2 99%   BMI 25.03 kg/m  Constitutional:  Pleasant female in no acute distress. Psychiatric: Normal mood and affect. Behavior is normal. EENT: Pupils normal.  Conjunctivae are normal. No scleral icterus. Neck supple.  Cardiovascular: Normal rate, regular rhythm. No edema Pulmonary/chest: Effort normal and breath sounds normal. No wheezing, rales or rhonchi. Abdominal: Soft, nondistended, nontender. Bowel sounds active throughout. There are no masses palpable. No hepatomegaly. Neurological: Alert and oriented to person place and time. Skin: Skin is warm and dry. No rashes noted.  , NP  05/04/2020, 2:36 PM

## 2020-05-04 NOTE — Progress Notes (Signed)
Assessment noted 

## 2020-06-09 ENCOUNTER — Other Ambulatory Visit: Payer: Self-pay

## 2020-06-09 ENCOUNTER — Ambulatory Visit
Admission: RE | Admit: 2020-06-09 | Discharge: 2020-06-09 | Disposition: A | Discharge status: Home / Self Care | Payer: Medicare HMO | Source: Ambulatory Visit | Attending: Nurse Practitioner | Admitting: Nurse Practitioner

## 2020-06-09 DIAGNOSIS — Z1231 Encounter for screening mammogram for malignant neoplasm of breast: Secondary | ICD-10-CM | POA: Diagnosis not present

## 2020-06-17 DIAGNOSIS — E785 Hyperlipidemia, unspecified: Secondary | ICD-10-CM | POA: Diagnosis not present

## 2020-06-17 DIAGNOSIS — E1169 Type 2 diabetes mellitus with other specified complication: Secondary | ICD-10-CM | POA: Diagnosis not present

## 2020-06-17 DIAGNOSIS — M79643 Pain in unspecified hand: Secondary | ICD-10-CM | POA: Diagnosis not present

## 2020-06-17 DIAGNOSIS — M1990 Unspecified osteoarthritis, unspecified site: Secondary | ICD-10-CM | POA: Diagnosis not present

## 2020-06-17 DIAGNOSIS — M0609 Rheumatoid arthritis without rheumatoid factor, multiple sites: Secondary | ICD-10-CM | POA: Diagnosis not present

## 2020-06-17 DIAGNOSIS — Z79899 Other long term (current) drug therapy: Secondary | ICD-10-CM | POA: Diagnosis not present

## 2020-07-20 ENCOUNTER — Encounter: Payer: Self-pay | Admitting: Internal Medicine

## 2020-07-20 ENCOUNTER — Ambulatory Visit (AMBULATORY_SURGERY_CENTER): Payer: Medicare HMO | Admitting: Internal Medicine

## 2020-07-20 ENCOUNTER — Other Ambulatory Visit: Payer: Self-pay

## 2020-07-20 VITALS — BP 142/44 | HR 52 | Temp 97.8°F | Resp 14 | Ht 65.0 in | Wt 150.0 lb

## 2020-07-20 DIAGNOSIS — R634 Abnormal weight loss: Secondary | ICD-10-CM

## 2020-07-20 DIAGNOSIS — K2100 Gastro-esophageal reflux disease with esophagitis, without bleeding: Secondary | ICD-10-CM

## 2020-07-20 DIAGNOSIS — R1310 Dysphagia, unspecified: Secondary | ICD-10-CM

## 2020-07-20 DIAGNOSIS — K222 Esophageal obstruction: Secondary | ICD-10-CM

## 2020-07-20 MED ORDER — OMEPRAZOLE 20 MG PO CPDR: 20.0000 mg | DELAYED_RELEASE_CAPSULE | Freq: Every day | ORAL | 11 refills | Status: AC

## 2020-07-20 MED ORDER — SODIUM CHLORIDE 0.9 % IV SOLN: 500.0000 mL | Freq: Once | INTRAVENOUS | Status: DC

## 2020-07-20 NOTE — Patient Instructions (Signed)
Handouts given for Esophageal stricture and Post-esophageal dilation diet.  YOU HAD AN ENDOSCOPIC PROCEDURE TODAY AT THE [facility]:   Refer to the procedure report that was given to you for any specific questions about what was found during the examination.  If the procedure report does not answer your questions, please call your gastroenterologist to clarify.  If you requested that your care partner not be given the details of your procedure findings, then the procedure report has been included in a sealed envelope for you to review at your convenience later.  YOU SHOULD EXPECT: Some feelings of bloating in the abdomen. Passage of more gas than usual.  Walking can help get rid of the air that was put into your GI tract during the procedure and reduce the bloating. If you had a lower endoscopy (such as a colonoscopy or flexible sigmoidoscopy) you may notice spotting of blood in your stool or on the toilet paper. If you underwent a bowel prep for your procedure, you may not have a normal bowel movement for a few days.  Please Note:  You might notice some irritation and congestion in your nose or some drainage.  This is from the oxygen used during your procedure.  There is no need for concern and it should clear up in a day or so.  SYMPTOMS TO REPORT IMMEDIATELY:  Following upper endoscopy (EGD)  Vomiting of blood or coffee ground material  New chest pain or pain under the shoulder blades  Painful or persistently difficult swallowing  New shortness of breath  Fever of 100F or higher  Black, tarry-looking stools  For urgent or emergent issues, a gastroenterologist can be reached at any hour by calling [phone]. Do not use MyChart messaging for urgent concerns.    DIET:  Follow the Post-esophageal dilation diet today.  Tomorrow you may have regular food.  ACTIVITY:  You should plan to take it easy for the rest of today and you should NOT DRIVE or use heavy machinery until  tomorrow (because of the sedation medicines used during the test).    FOLLOW UP: Our staff will call the number listed on your records 48-72 hours following your procedure to check on you and address any questions or concerns that you may have regarding the information given to you following your procedure. If we do not reach you, we will leave a message.  We will attempt to reach you two times.  During this call, we will ask if you have developed any symptoms of COVID 19. If you develop any symptoms (ie: fever, flu-like symptoms, shortness of breath, cough etc.) before then, please call [phone].  If you test positive for Covid 19 in the 2 weeks post procedure, please call and report this information to [place].    If any biopsies were taken you will be contacted by phone or by letter within the next 1-3 weeks.  Please call us at [phone] if you have not heard about the biopsies in 3 weeks.    SIGNATURES/CONFIDENTIALITY: You and/or your care partner have signed paperwork which will be entered into your electronic medical record.  These signatures attest to the fact that that the information above on your After Visit Summary has been reviewed and is understood.  Full responsibility of the confidentiality of this discharge information lies with you and/or your care-partner.

## 2020-07-20 NOTE — Progress Notes (Signed)
Check-in-AER  VITAL SIGNS-VV

## 2020-07-20 NOTE — Progress Notes (Signed)
pt tolerated well. VSS. awake and to recovery. Report given to RN.  Bite block removed with ease/manioulated during dilation. No trauma.

## 2020-07-20 NOTE — Progress Notes (Signed)
Called to room to assist during endoscopic procedure.  Patient ID and intended procedure confirmed with present staff. Received instructions for my participation in the procedure from the performing physician.  

## 2020-07-20 NOTE — Op Note (Signed)
[facility] Patient Name: [name] Procedure Date: 07/20/2020 11:04 AM MRN: [id number] Endoscopist: Wilhemina Bonito. Marina Goodell , MD Age: 80 Referring MD:  Date of Birth: 1940-09-10 Gender: Female Account #: [id number] Procedure:                Upper GI endoscopy with [facility] dilation of the                            esophagus. 80 French Indications:              Dysphagia Medicines:                Monitored Anesthesia Care Procedure:                Pre-Anesthesia Assessment:                           - Prior to the procedure, a History and Physical                            was performed, and patient medications and                            allergies were reviewed. The patient's tolerance of                            previous anesthesia was also reviewed. The risks                            and benefits of the procedure and the sedation                            options and risks were discussed with the patient.                            All questions were answered, and informed consent                            was obtained. Prior Anticoagulants: The patient has                            taken no previous anticoagulant or antiplatelet                            agents. ASA Grade Assessment: II - A patient with                            mild systemic disease. After reviewing the risks                            and benefits, the patient was deemed in                            satisfactory condition to undergo the procedure.  After obtaining informed consent, the endoscope was                            passed under direct vision. Throughout the                            procedure, the patient's blood pressure, pulse, and                            oxygen saturations were monitored continuously. The                            GIF [id number] #[id number] was introduced through the                            mouth, and advanced to the second part of  duodenum.                            The upper GI endoscopy was accomplished without                            difficulty. The patient tolerated the procedure                            well. Scope In: Scope Out: Findings:                 One benign-appearing, intrinsic moderate stenosis                            was found 38 cm from the incisors. This stenosis                            measured 1.5 cm (inner diameter). In addition,                            there was a partial web at 35 cm. After completing                            the endoscopic survey, the scope was withdrawn.                            Dilation was performed with a Maloney dilator with                            no resistance at 54 Fr. there was very mild                            resistance and trace heme.                           The exam of the esophagus was otherwise remarkable  for mild esophagitis as manifested by erythema and                            edema at the Z-line .                           The stomach was normal.                           The examined duodenum was normal.                           The cardia and gastric fundus were normal on                            retroflexion. Complications:            No immediate complications. Estimated Blood Loss:     Estimated blood loss: none. 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 Dilated.                           - Reflux esophagitis.                           - Otherwise normal EGD.                           - No specimens collected. Recommendation:           1. Patient has a contact number available for                            emergencies. The signs and symptoms of potential                            delayed complications were discussed with the                            patient. Return to normal activities tomorrow.                             Written discharge instructions were provided to the                            patient.                           2. Post dilation diet.                           3. Prescribe omeprazole 20 mg daily; #30; 11  refills. This will protect her esophagus from                            inflammation.                           4. Return to the care of your primary provider. GI                            follow-up as needed Wilhemina Bonito. Marina Goodell, MD 07/20/2020 11:21:35 AM This report has been signed electronically.

## 2020-07-22 ENCOUNTER — Telehealth: Payer: Self-pay

## 2020-07-22 ENCOUNTER — Telehealth: Payer: Self-pay | Admitting: *Deleted

## 2020-07-22 NOTE — Telephone Encounter (Signed)
First post procedure follow up call, no answer 

## 2020-07-22 NOTE — Telephone Encounter (Signed)
Second follow up call made. 

## 2020-08-25 DIAGNOSIS — I10 Essential (primary) hypertension: Secondary | ICD-10-CM | POA: Diagnosis not present

## 2020-08-25 DIAGNOSIS — E1169 Type 2 diabetes mellitus with other specified complication: Secondary | ICD-10-CM | POA: Diagnosis not present

## 2020-08-25 DIAGNOSIS — I700 Atherosclerosis of aorta: Secondary | ICD-10-CM | POA: Diagnosis not present

## 2020-08-25 DIAGNOSIS — E782 Mixed hyperlipidemia: Secondary | ICD-10-CM | POA: Diagnosis not present

## 2020-08-25 DIAGNOSIS — E785 Hyperlipidemia, unspecified: Secondary | ICD-10-CM | POA: Diagnosis not present

## 2020-08-25 DIAGNOSIS — E876 Hypokalemia: Secondary | ICD-10-CM | POA: Diagnosis not present

## 2020-08-25 DIAGNOSIS — Z6824 Body mass index (BMI) 24.0-24.9, adult: Secondary | ICD-10-CM | POA: Diagnosis not present

## 2020-08-25 DIAGNOSIS — E890 Postprocedural hypothyroidism: Secondary | ICD-10-CM | POA: Diagnosis not present

## 2020-09-09 DIAGNOSIS — H25813 Combined forms of age-related cataract, bilateral: Secondary | ICD-10-CM | POA: Diagnosis not present

## 2020-09-09 DIAGNOSIS — E119 Type 2 diabetes mellitus without complications: Secondary | ICD-10-CM | POA: Diagnosis not present

## 2020-09-23 DIAGNOSIS — M1990 Unspecified osteoarthritis, unspecified site: Secondary | ICD-10-CM | POA: Diagnosis not present

## 2020-09-23 DIAGNOSIS — Z780 Asymptomatic menopausal state: Secondary | ICD-10-CM | POA: Diagnosis not present

## 2020-09-23 DIAGNOSIS — Z23 Encounter for immunization: Secondary | ICD-10-CM | POA: Diagnosis not present

## 2020-09-23 DIAGNOSIS — E1169 Type 2 diabetes mellitus with other specified complication: Secondary | ICD-10-CM | POA: Diagnosis not present

## 2020-09-23 DIAGNOSIS — E785 Hyperlipidemia, unspecified: Secondary | ICD-10-CM | POA: Diagnosis not present

## 2020-09-23 DIAGNOSIS — Z79899 Other long term (current) drug therapy: Secondary | ICD-10-CM | POA: Diagnosis not present

## 2020-09-23 DIAGNOSIS — M0609 Rheumatoid arthritis without rheumatoid factor, multiple sites: Secondary | ICD-10-CM | POA: Diagnosis not present

## 2020-09-23 DIAGNOSIS — M79643 Pain in unspecified hand: Secondary | ICD-10-CM | POA: Diagnosis not present

## 2020-09-23 DIAGNOSIS — Z13820 Encounter for screening for osteoporosis: Secondary | ICD-10-CM | POA: Diagnosis not present

## 2020-11-25 DIAGNOSIS — E890 Postprocedural hypothyroidism: Secondary | ICD-10-CM | POA: Diagnosis not present

## 2020-11-25 DIAGNOSIS — E782 Mixed hyperlipidemia: Secondary | ICD-10-CM | POA: Diagnosis not present

## 2020-11-25 DIAGNOSIS — M109 Gout, unspecified: Secondary | ICD-10-CM | POA: Diagnosis not present

## 2020-11-25 DIAGNOSIS — R634 Abnormal weight loss: Secondary | ICD-10-CM | POA: Diagnosis not present

## 2020-11-25 DIAGNOSIS — M533 Sacrococcygeal disorders, not elsewhere classified: Secondary | ICD-10-CM | POA: Diagnosis not present

## 2020-11-25 DIAGNOSIS — Z9181 History of falling: Secondary | ICD-10-CM | POA: Diagnosis not present

## 2020-11-25 DIAGNOSIS — E785 Hyperlipidemia, unspecified: Secondary | ICD-10-CM | POA: Diagnosis not present

## 2020-11-25 DIAGNOSIS — Z139 Encounter for screening, unspecified: Secondary | ICD-10-CM | POA: Diagnosis not present

## 2020-11-25 DIAGNOSIS — E1169 Type 2 diabetes mellitus with other specified complication: Secondary | ICD-10-CM | POA: Diagnosis not present

## 2021-02-08 DIAGNOSIS — E1169 Type 2 diabetes mellitus with other specified complication: Secondary | ICD-10-CM | POA: Diagnosis not present

## 2021-02-08 DIAGNOSIS — M0609 Rheumatoid arthritis without rheumatoid factor, multiple sites: Secondary | ICD-10-CM | POA: Diagnosis not present

## 2021-02-08 DIAGNOSIS — M1990 Unspecified osteoarthritis, unspecified site: Secondary | ICD-10-CM | POA: Diagnosis not present

## 2021-02-08 DIAGNOSIS — Z13820 Encounter for screening for osteoporosis: Secondary | ICD-10-CM | POA: Diagnosis not present

## 2021-02-08 DIAGNOSIS — M79643 Pain in unspecified hand: Secondary | ICD-10-CM | POA: Diagnosis not present

## 2021-02-08 DIAGNOSIS — Z79899 Other long term (current) drug therapy: Secondary | ICD-10-CM | POA: Diagnosis not present

## 2021-02-08 DIAGNOSIS — E785 Hyperlipidemia, unspecified: Secondary | ICD-10-CM | POA: Diagnosis not present

## 2021-03-09 DIAGNOSIS — R102 Pelvic and perineal pain: Secondary | ICD-10-CM | POA: Diagnosis not present

## 2021-03-09 DIAGNOSIS — M25551 Pain in right hip: Secondary | ICD-10-CM | POA: Diagnosis not present

## 2021-03-09 DIAGNOSIS — R634 Abnormal weight loss: Secondary | ICD-10-CM | POA: Diagnosis not present

## 2021-03-09 DIAGNOSIS — M5431 Sciatica, right side: Secondary | ICD-10-CM | POA: Diagnosis not present

## 2021-03-09 DIAGNOSIS — R1031 Right lower quadrant pain: Secondary | ICD-10-CM | POA: Diagnosis not present

## 2021-03-17 DIAGNOSIS — R195 Other fecal abnormalities: Secondary | ICD-10-CM | POA: Diagnosis not present

## 2021-03-17 DIAGNOSIS — R1031 Right lower quadrant pain: Secondary | ICD-10-CM | POA: Diagnosis not present

## 2021-03-17 DIAGNOSIS — K5900 Constipation, unspecified: Secondary | ICD-10-CM | POA: Diagnosis not present

## 2021-04-05 DIAGNOSIS — M109 Gout, unspecified: Secondary | ICD-10-CM | POA: Diagnosis not present

## 2021-04-05 DIAGNOSIS — E1169 Type 2 diabetes mellitus with other specified complication: Secondary | ICD-10-CM | POA: Diagnosis not present

## 2021-04-05 DIAGNOSIS — R634 Abnormal weight loss: Secondary | ICD-10-CM | POA: Diagnosis not present

## 2021-04-05 DIAGNOSIS — E785 Hyperlipidemia, unspecified: Secondary | ICD-10-CM | POA: Diagnosis not present

## 2021-04-05 DIAGNOSIS — E890 Postprocedural hypothyroidism: Secondary | ICD-10-CM | POA: Diagnosis not present

## 2021-04-05 DIAGNOSIS — Z1331 Encounter for screening for depression: Secondary | ICD-10-CM | POA: Diagnosis not present

## 2021-04-05 DIAGNOSIS — I700 Atherosclerosis of aorta: Secondary | ICD-10-CM | POA: Diagnosis not present

## 2021-04-05 DIAGNOSIS — E782 Mixed hyperlipidemia: Secondary | ICD-10-CM | POA: Diagnosis not present

## 2021-04-05 DIAGNOSIS — I10 Essential (primary) hypertension: Secondary | ICD-10-CM | POA: Diagnosis not present

## 2021-05-10 DIAGNOSIS — M1990 Unspecified osteoarthritis, unspecified site: Secondary | ICD-10-CM | POA: Diagnosis not present

## 2021-05-10 DIAGNOSIS — Z13820 Encounter for screening for osteoporosis: Secondary | ICD-10-CM | POA: Diagnosis not present

## 2021-05-10 DIAGNOSIS — E785 Hyperlipidemia, unspecified: Secondary | ICD-10-CM | POA: Diagnosis not present

## 2021-05-10 DIAGNOSIS — M79643 Pain in unspecified hand: Secondary | ICD-10-CM | POA: Diagnosis not present

## 2021-05-10 DIAGNOSIS — M0609 Rheumatoid arthritis without rheumatoid factor, multiple sites: Secondary | ICD-10-CM | POA: Diagnosis not present

## 2021-05-10 DIAGNOSIS — E1169 Type 2 diabetes mellitus with other specified complication: Secondary | ICD-10-CM | POA: Diagnosis not present

## 2021-05-10 DIAGNOSIS — Z79899 Other long term (current) drug therapy: Secondary | ICD-10-CM | POA: Diagnosis not present

## 2021-08-09 DIAGNOSIS — M0600 Rheumatoid arthritis without rheumatoid factor, unspecified site: Secondary | ICD-10-CM | POA: Diagnosis not present

## 2021-08-09 DIAGNOSIS — E1169 Type 2 diabetes mellitus with other specified complication: Secondary | ICD-10-CM | POA: Diagnosis not present

## 2021-08-09 DIAGNOSIS — I10 Essential (primary) hypertension: Secondary | ICD-10-CM | POA: Diagnosis not present

## 2021-08-09 DIAGNOSIS — R634 Abnormal weight loss: Secondary | ICD-10-CM | POA: Diagnosis not present

## 2021-08-09 DIAGNOSIS — E785 Hyperlipidemia, unspecified: Secondary | ICD-10-CM | POA: Diagnosis not present

## 2021-08-09 DIAGNOSIS — E890 Postprocedural hypothyroidism: Secondary | ICD-10-CM | POA: Diagnosis not present

## 2021-08-09 DIAGNOSIS — E782 Mixed hyperlipidemia: Secondary | ICD-10-CM | POA: Diagnosis not present

## 2021-08-09 DIAGNOSIS — M109 Gout, unspecified: Secondary | ICD-10-CM | POA: Diagnosis not present

## 2021-08-09 DIAGNOSIS — M25512 Pain in left shoulder: Secondary | ICD-10-CM | POA: Diagnosis not present

## 2021-08-10 ENCOUNTER — Other Ambulatory Visit: Payer: Self-pay | Admitting: Family Medicine

## 2021-08-10 DIAGNOSIS — Z1231 Encounter for screening mammogram for malignant neoplasm of breast: Secondary | ICD-10-CM

## 2021-08-16 DIAGNOSIS — M1990 Unspecified osteoarthritis, unspecified site: Secondary | ICD-10-CM | POA: Diagnosis not present

## 2021-08-16 DIAGNOSIS — E785 Hyperlipidemia, unspecified: Secondary | ICD-10-CM | POA: Diagnosis not present

## 2021-08-16 DIAGNOSIS — E1169 Type 2 diabetes mellitus with other specified complication: Secondary | ICD-10-CM | POA: Diagnosis not present

## 2021-08-16 DIAGNOSIS — Z79899 Other long term (current) drug therapy: Secondary | ICD-10-CM | POA: Diagnosis not present

## 2021-08-16 DIAGNOSIS — M0609 Rheumatoid arthritis without rheumatoid factor, multiple sites: Secondary | ICD-10-CM | POA: Diagnosis not present

## 2021-08-16 DIAGNOSIS — M79643 Pain in unspecified hand: Secondary | ICD-10-CM | POA: Diagnosis not present

## 2021-08-16 DIAGNOSIS — M25512 Pain in left shoulder: Secondary | ICD-10-CM | POA: Diagnosis not present

## 2021-08-16 DIAGNOSIS — Z13820 Encounter for screening for osteoporosis: Secondary | ICD-10-CM | POA: Diagnosis not present

## 2021-08-25 DIAGNOSIS — E876 Hypokalemia: Secondary | ICD-10-CM | POA: Diagnosis not present

## 2021-08-31 ENCOUNTER — Ambulatory Visit: Payer: Medicare HMO

## 2021-09-13 ENCOUNTER — Ambulatory Visit
Admission: RE | Admit: 2021-09-13 | Discharge: 2021-09-13 | Disposition: A | Discharge status: Home / Self Care | Payer: Medicare HMO | Source: Ambulatory Visit | Attending: Family Medicine | Admitting: Family Medicine

## 2021-09-13 DIAGNOSIS — Z1231 Encounter for screening mammogram for malignant neoplasm of breast: Secondary | ICD-10-CM | POA: Diagnosis not present

## 2021-10-24 DIAGNOSIS — Z23 Encounter for immunization: Secondary | ICD-10-CM | POA: Diagnosis not present

## 2021-11-14 DIAGNOSIS — K5909 Other constipation: Secondary | ICD-10-CM | POA: Diagnosis not present

## 2021-11-14 DIAGNOSIS — M549 Dorsalgia, unspecified: Secondary | ICD-10-CM | POA: Diagnosis not present

## 2021-11-14 DIAGNOSIS — R1032 Left lower quadrant pain: Secondary | ICD-10-CM | POA: Diagnosis not present

## 2021-11-16 DIAGNOSIS — M79643 Pain in unspecified hand: Secondary | ICD-10-CM | POA: Diagnosis not present

## 2021-11-16 DIAGNOSIS — E785 Hyperlipidemia, unspecified: Secondary | ICD-10-CM | POA: Diagnosis not present

## 2021-11-16 DIAGNOSIS — E1169 Type 2 diabetes mellitus with other specified complication: Secondary | ICD-10-CM | POA: Diagnosis not present

## 2021-11-16 DIAGNOSIS — Z13820 Encounter for screening for osteoporosis: Secondary | ICD-10-CM | POA: Diagnosis not present

## 2021-11-16 DIAGNOSIS — Z79899 Other long term (current) drug therapy: Secondary | ICD-10-CM | POA: Diagnosis not present

## 2021-11-16 DIAGNOSIS — M0609 Rheumatoid arthritis without rheumatoid factor, multiple sites: Secondary | ICD-10-CM | POA: Diagnosis not present

## 2021-11-16 DIAGNOSIS — M1990 Unspecified osteoarthritis, unspecified site: Secondary | ICD-10-CM | POA: Diagnosis not present

## 2021-12-15 DIAGNOSIS — Z9181 History of falling: Secondary | ICD-10-CM | POA: Diagnosis not present

## 2021-12-15 DIAGNOSIS — I10 Essential (primary) hypertension: Secondary | ICD-10-CM | POA: Diagnosis not present

## 2021-12-15 DIAGNOSIS — E1169 Type 2 diabetes mellitus with other specified complication: Secondary | ICD-10-CM | POA: Diagnosis not present

## 2021-12-15 DIAGNOSIS — E782 Mixed hyperlipidemia: Secondary | ICD-10-CM | POA: Diagnosis not present

## 2021-12-15 DIAGNOSIS — M109 Gout, unspecified: Secondary | ICD-10-CM | POA: Diagnosis not present

## 2021-12-15 DIAGNOSIS — R1032 Left lower quadrant pain: Secondary | ICD-10-CM | POA: Diagnosis not present

## 2021-12-15 DIAGNOSIS — E785 Hyperlipidemia, unspecified: Secondary | ICD-10-CM | POA: Diagnosis not present

## 2021-12-15 DIAGNOSIS — R350 Frequency of micturition: Secondary | ICD-10-CM | POA: Diagnosis not present

## 2021-12-15 DIAGNOSIS — E890 Postprocedural hypothyroidism: Secondary | ICD-10-CM | POA: Diagnosis not present

## 2021-12-15 DIAGNOSIS — Z139 Encounter for screening, unspecified: Secondary | ICD-10-CM | POA: Diagnosis not present

## 2022-02-21 DIAGNOSIS — M79643 Pain in unspecified hand: Secondary | ICD-10-CM | POA: Diagnosis not present

## 2022-02-21 DIAGNOSIS — E785 Hyperlipidemia, unspecified: Secondary | ICD-10-CM | POA: Diagnosis not present

## 2022-02-21 DIAGNOSIS — M0609 Rheumatoid arthritis without rheumatoid factor, multiple sites: Secondary | ICD-10-CM | POA: Diagnosis not present

## 2022-02-21 DIAGNOSIS — Z13820 Encounter for screening for osteoporosis: Secondary | ICD-10-CM | POA: Diagnosis not present

## 2022-02-21 DIAGNOSIS — E1169 Type 2 diabetes mellitus with other specified complication: Secondary | ICD-10-CM | POA: Diagnosis not present

## 2022-02-21 DIAGNOSIS — M1990 Unspecified osteoarthritis, unspecified site: Secondary | ICD-10-CM | POA: Diagnosis not present

## 2022-02-21 DIAGNOSIS — Z79899 Other long term (current) drug therapy: Secondary | ICD-10-CM | POA: Diagnosis not present

## 2022-04-18 DIAGNOSIS — I700 Atherosclerosis of aorta: Secondary | ICD-10-CM | POA: Diagnosis not present

## 2022-04-18 DIAGNOSIS — M0600 Rheumatoid arthritis without rheumatoid factor, unspecified site: Secondary | ICD-10-CM | POA: Diagnosis not present

## 2022-04-18 DIAGNOSIS — E890 Postprocedural hypothyroidism: Secondary | ICD-10-CM | POA: Diagnosis not present

## 2022-04-18 DIAGNOSIS — I10 Essential (primary) hypertension: Secondary | ICD-10-CM | POA: Diagnosis not present

## 2022-04-18 DIAGNOSIS — K921 Melena: Secondary | ICD-10-CM | POA: Diagnosis not present

## 2022-04-18 DIAGNOSIS — E1169 Type 2 diabetes mellitus with other specified complication: Secondary | ICD-10-CM | POA: Diagnosis not present

## 2022-04-18 DIAGNOSIS — E782 Mixed hyperlipidemia: Secondary | ICD-10-CM | POA: Diagnosis not present

## 2022-04-18 DIAGNOSIS — M109 Gout, unspecified: Secondary | ICD-10-CM | POA: Diagnosis not present

## 2022-04-18 DIAGNOSIS — E785 Hyperlipidemia, unspecified: Secondary | ICD-10-CM | POA: Diagnosis not present

## 2022-05-21 DIAGNOSIS — D72819 Decreased white blood cell count, unspecified: Secondary | ICD-10-CM | POA: Diagnosis not present

## 2022-06-08 DIAGNOSIS — R0789 Other chest pain: Secondary | ICD-10-CM | POA: Diagnosis not present

## 2022-07-04 DIAGNOSIS — E785 Hyperlipidemia, unspecified: Secondary | ICD-10-CM | POA: Diagnosis not present

## 2022-07-04 DIAGNOSIS — Z79899 Other long term (current) drug therapy: Secondary | ICD-10-CM | POA: Diagnosis not present

## 2022-07-04 DIAGNOSIS — M79643 Pain in unspecified hand: Secondary | ICD-10-CM | POA: Diagnosis not present

## 2022-07-04 DIAGNOSIS — Z13820 Encounter for screening for osteoporosis: Secondary | ICD-10-CM | POA: Diagnosis not present

## 2022-07-04 DIAGNOSIS — M549 Dorsalgia, unspecified: Secondary | ICD-10-CM | POA: Diagnosis not present

## 2022-07-04 DIAGNOSIS — M1990 Unspecified osteoarthritis, unspecified site: Secondary | ICD-10-CM | POA: Diagnosis not present

## 2022-07-04 DIAGNOSIS — E1169 Type 2 diabetes mellitus with other specified complication: Secondary | ICD-10-CM | POA: Diagnosis not present

## 2022-07-04 DIAGNOSIS — M0609 Rheumatoid arthritis without rheumatoid factor, multiple sites: Secondary | ICD-10-CM | POA: Diagnosis not present

## 2022-07-24 DIAGNOSIS — K921 Melena: Secondary | ICD-10-CM | POA: Diagnosis not present

## 2022-07-24 DIAGNOSIS — Z1212 Encounter for screening for malignant neoplasm of rectum: Secondary | ICD-10-CM | POA: Diagnosis not present

## 2022-08-22 DIAGNOSIS — E1169 Type 2 diabetes mellitus with other specified complication: Secondary | ICD-10-CM | POA: Diagnosis not present

## 2022-08-22 DIAGNOSIS — I700 Atherosclerosis of aorta: Secondary | ICD-10-CM | POA: Diagnosis not present

## 2022-08-22 DIAGNOSIS — M109 Gout, unspecified: Secondary | ICD-10-CM | POA: Diagnosis not present

## 2022-08-22 DIAGNOSIS — E782 Mixed hyperlipidemia: Secondary | ICD-10-CM | POA: Diagnosis not present

## 2022-08-22 DIAGNOSIS — I10 Essential (primary) hypertension: Secondary | ICD-10-CM | POA: Diagnosis not present

## 2022-08-22 DIAGNOSIS — E890 Postprocedural hypothyroidism: Secondary | ICD-10-CM | POA: Diagnosis not present

## 2022-08-22 DIAGNOSIS — M0600 Rheumatoid arthritis without rheumatoid factor, unspecified site: Secondary | ICD-10-CM | POA: Diagnosis not present

## 2022-08-24 ENCOUNTER — Ambulatory Visit: Payer: Medicare HMO | Admitting: Gastroenterology

## 2022-10-04 DIAGNOSIS — M0609 Rheumatoid arthritis without rheumatoid factor, multiple sites: Secondary | ICD-10-CM | POA: Diagnosis not present

## 2022-10-04 DIAGNOSIS — M549 Dorsalgia, unspecified: Secondary | ICD-10-CM | POA: Diagnosis not present

## 2022-10-04 DIAGNOSIS — Z79899 Other long term (current) drug therapy: Secondary | ICD-10-CM | POA: Diagnosis not present

## 2022-10-04 DIAGNOSIS — Z23 Encounter for immunization: Secondary | ICD-10-CM | POA: Diagnosis not present

## 2022-10-04 DIAGNOSIS — E785 Hyperlipidemia, unspecified: Secondary | ICD-10-CM | POA: Diagnosis not present

## 2022-10-04 DIAGNOSIS — Z13820 Encounter for screening for osteoporosis: Secondary | ICD-10-CM | POA: Diagnosis not present

## 2022-10-04 DIAGNOSIS — M1990 Unspecified osteoarthritis, unspecified site: Secondary | ICD-10-CM | POA: Diagnosis not present

## 2022-10-04 DIAGNOSIS — M79643 Pain in unspecified hand: Secondary | ICD-10-CM | POA: Diagnosis not present

## 2022-10-04 DIAGNOSIS — E1169 Type 2 diabetes mellitus with other specified complication: Secondary | ICD-10-CM | POA: Diagnosis not present

## 2022-10-05 ENCOUNTER — Ambulatory Visit: Payer: Medicare HMO | Admitting: Physician Assistant

## 2022-10-18 ENCOUNTER — Other Ambulatory Visit: Payer: Self-pay | Admitting: Family Medicine

## 2022-10-18 DIAGNOSIS — Z1231 Encounter for screening mammogram for malignant neoplasm of breast: Secondary | ICD-10-CM

## 2022-10-23 ENCOUNTER — Ambulatory Visit: Payer: Medicare HMO

## 2022-11-21 ENCOUNTER — Ambulatory Visit: Payer: Medicare HMO

## 2022-12-26 DIAGNOSIS — Z9181 History of falling: Secondary | ICD-10-CM | POA: Diagnosis not present

## 2022-12-26 DIAGNOSIS — I700 Atherosclerosis of aorta: Secondary | ICD-10-CM | POA: Diagnosis not present

## 2022-12-26 DIAGNOSIS — E782 Mixed hyperlipidemia: Secondary | ICD-10-CM | POA: Diagnosis not present

## 2022-12-26 DIAGNOSIS — I10 Essential (primary) hypertension: Secondary | ICD-10-CM | POA: Diagnosis not present

## 2022-12-26 DIAGNOSIS — M542 Cervicalgia: Secondary | ICD-10-CM | POA: Diagnosis not present

## 2022-12-26 DIAGNOSIS — Z139 Encounter for screening, unspecified: Secondary | ICD-10-CM | POA: Diagnosis not present

## 2022-12-26 DIAGNOSIS — E1169 Type 2 diabetes mellitus with other specified complication: Secondary | ICD-10-CM | POA: Diagnosis not present

## 2022-12-26 DIAGNOSIS — E890 Postprocedural hypothyroidism: Secondary | ICD-10-CM | POA: Diagnosis not present

## 2022-12-26 DIAGNOSIS — M109 Gout, unspecified: Secondary | ICD-10-CM | POA: Diagnosis not present

## 2023-01-12 IMAGING — MG MM DIGITAL SCREENING BILAT W/ TOMO AND CAD
6 of 12 series · 6 of 36 positions shown · non-contrast
Comparison: Previous exam(s).

ACR Breast Density Category a: The breast tissue is almost entirely
fatty.

CLINICAL DATA: Screening.

EXAM:
DIGITAL SCREENING BILATERAL MAMMOGRAM WITH TOMOSYNTHESIS AND CAD
TECHNIQUE: Bilateral screening digital craniocaudal and mediolateral oblique
mammograms were obtained. Bilateral screening digital breast
tomosynthesis was performed. The images were evaluated with
computer-aided detection.

[R MLO synth-2D (1 of 2)]
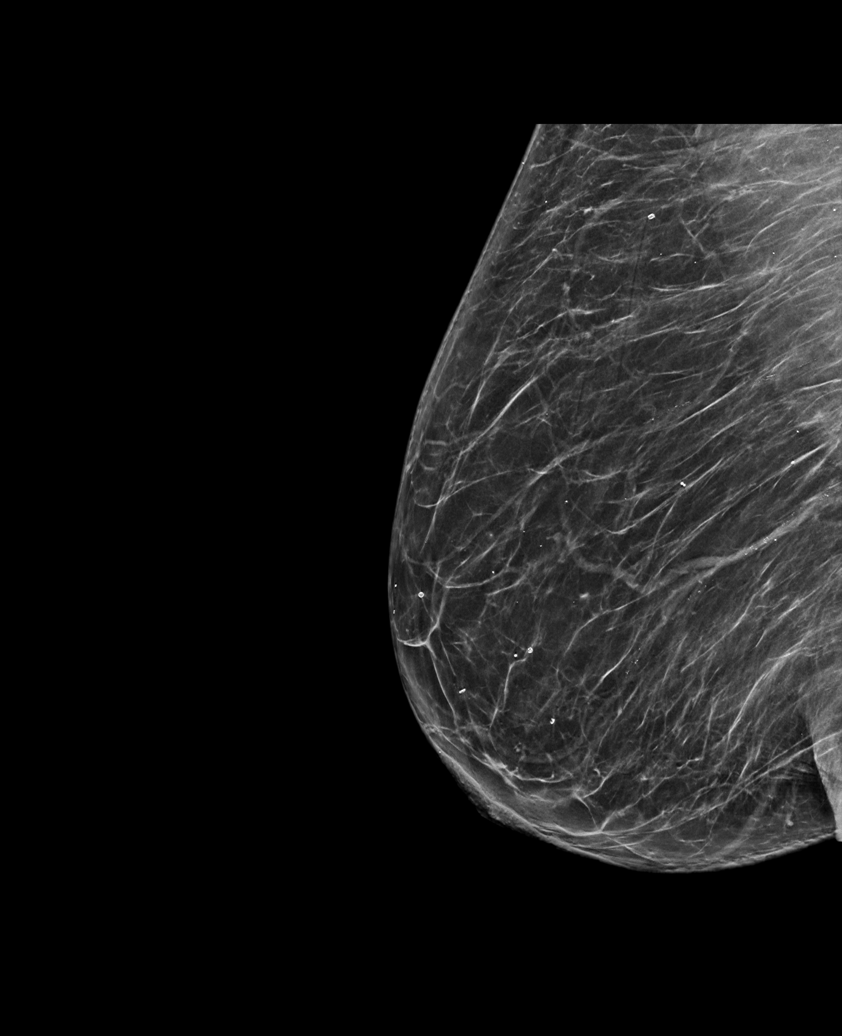

[L MLO synth-2D]
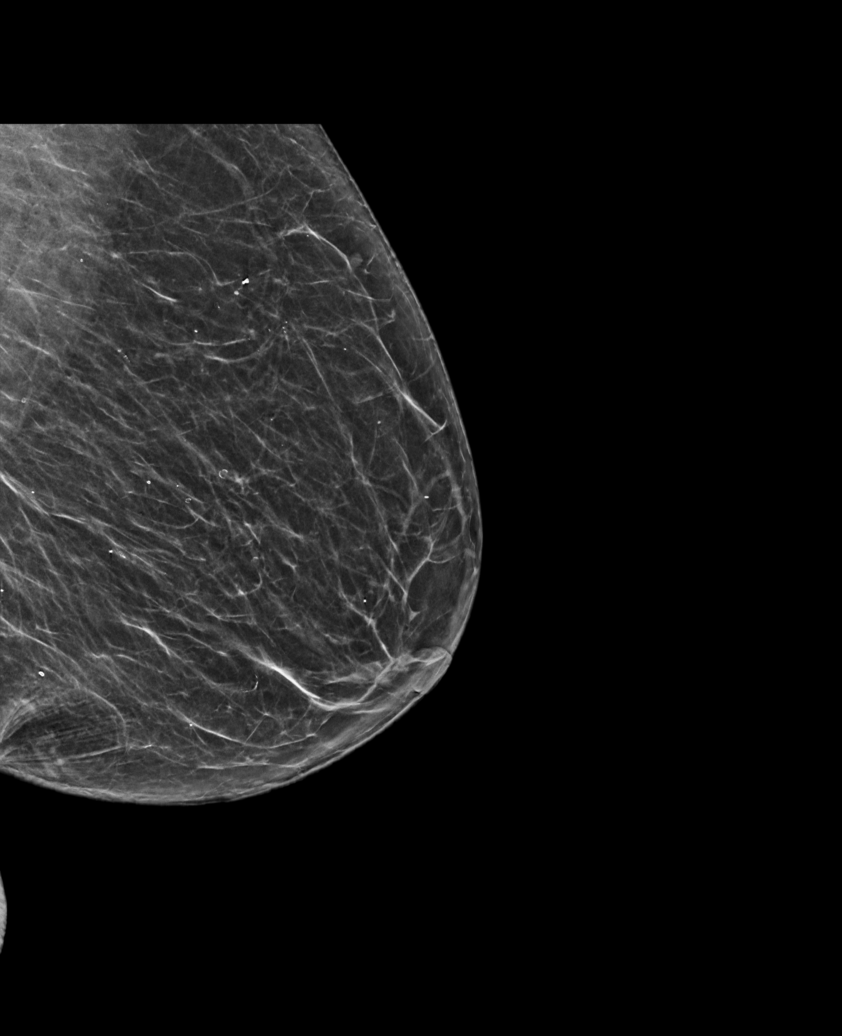

[L CC synth-2D]
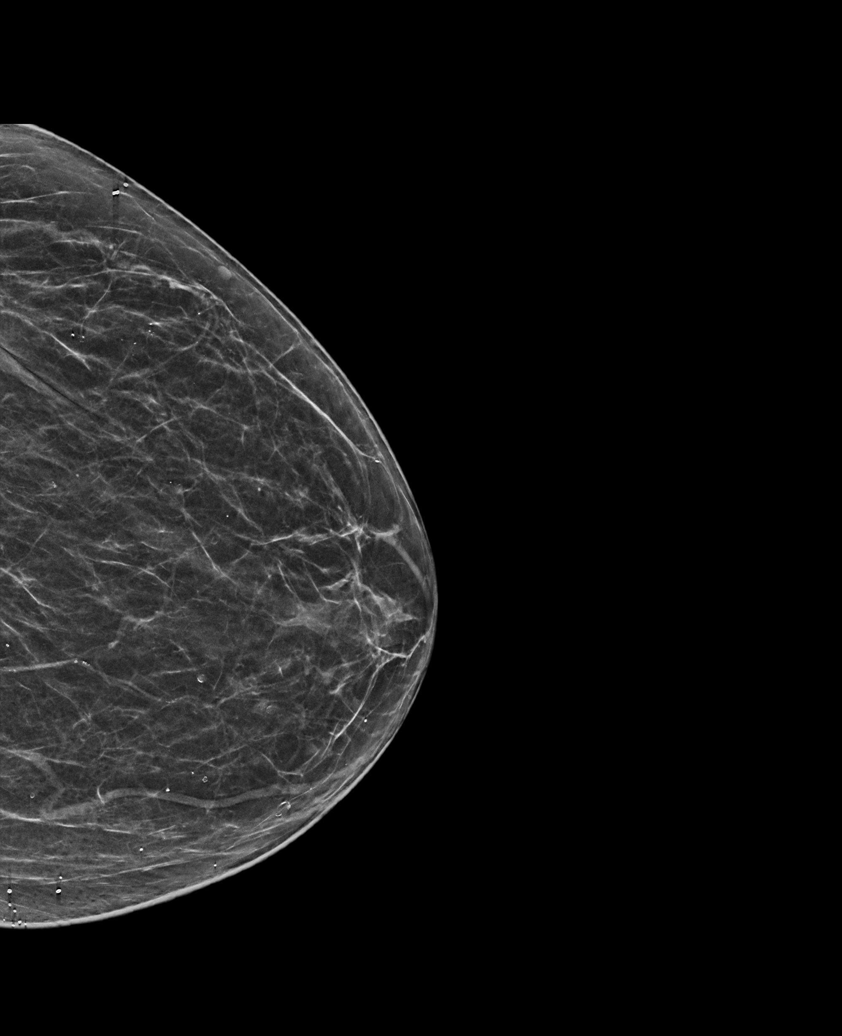

[R MLO synth-2D (2 of 2)]
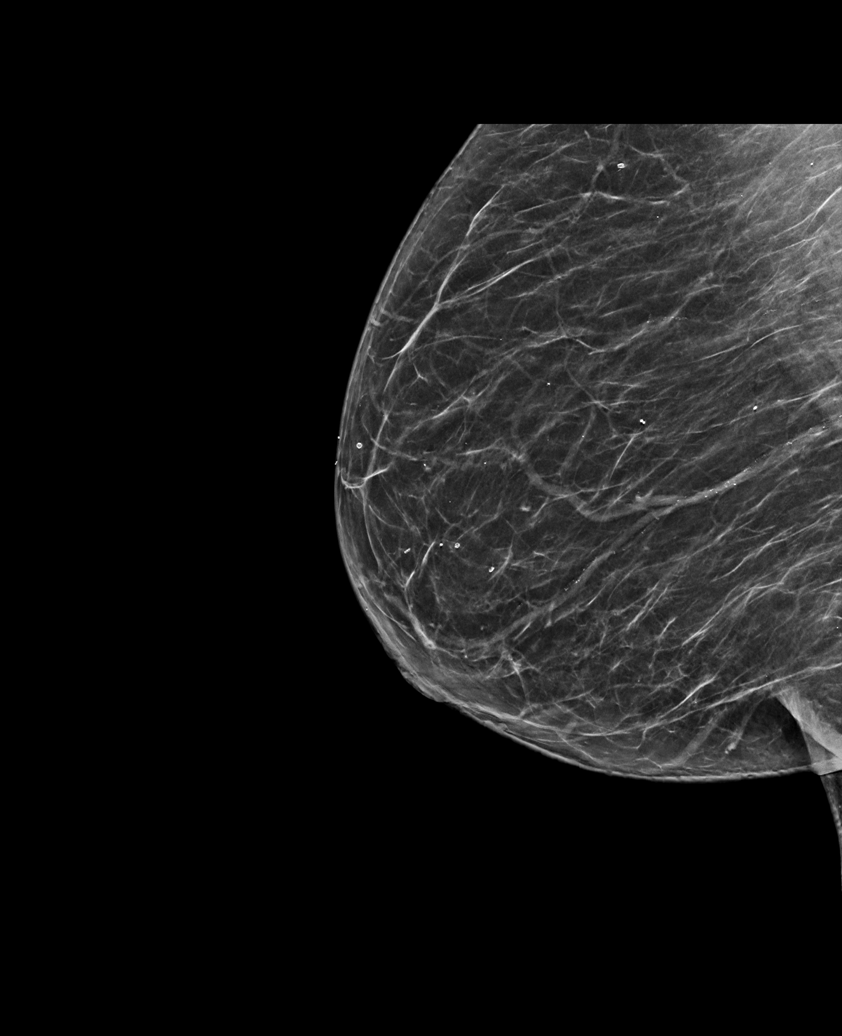

[R CC synth-2D (1 of 2)]
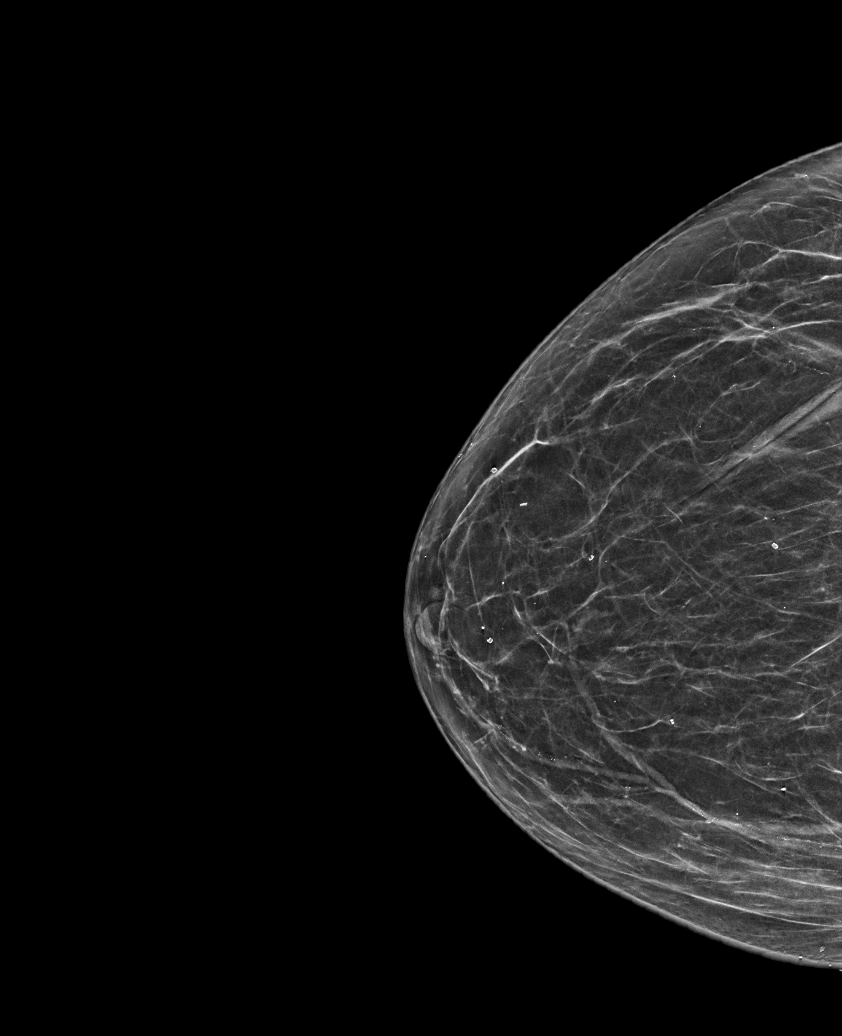

[R CC synth-2D (2 of 2)]
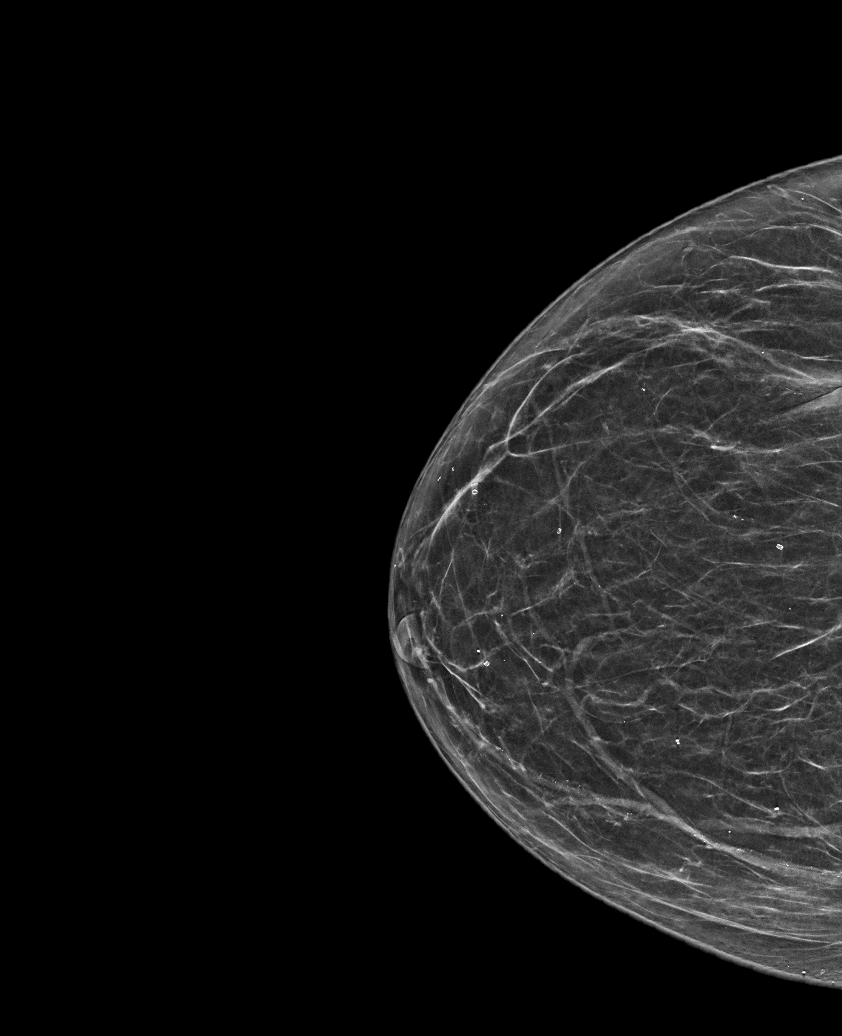

[6 of 36 positions shown; findings below may reference images not displayed]

FINDINGS: There are no findings suspicious for malignancy. The images were
evaluated with computer-aided detection.
IMPRESSION: No mammographic evidence of malignancy. A result letter of this
screening mammogram will be mailed directly to the patient.

RECOMMENDATION:
Screening mammogram in one year. (Code:JP-J-DD5)

BI-RADS CATEGORY  1: Negative.

## 2023-01-16 DIAGNOSIS — M0609 Rheumatoid arthritis without rheumatoid factor, multiple sites: Secondary | ICD-10-CM | POA: Diagnosis not present

## 2023-01-16 DIAGNOSIS — E785 Hyperlipidemia, unspecified: Secondary | ICD-10-CM | POA: Diagnosis not present

## 2023-01-16 DIAGNOSIS — M549 Dorsalgia, unspecified: Secondary | ICD-10-CM | POA: Diagnosis not present

## 2023-01-16 DIAGNOSIS — M79643 Pain in unspecified hand: Secondary | ICD-10-CM | POA: Diagnosis not present

## 2023-01-16 DIAGNOSIS — Z79899 Other long term (current) drug therapy: Secondary | ICD-10-CM | POA: Diagnosis not present

## 2023-01-16 DIAGNOSIS — M1990 Unspecified osteoarthritis, unspecified site: Secondary | ICD-10-CM | POA: Diagnosis not present

## 2023-01-16 DIAGNOSIS — E1169 Type 2 diabetes mellitus with other specified complication: Secondary | ICD-10-CM | POA: Diagnosis not present

## 2023-01-16 DIAGNOSIS — M542 Cervicalgia: Secondary | ICD-10-CM | POA: Diagnosis not present

## 2023-04-17 DIAGNOSIS — M1990 Unspecified osteoarthritis, unspecified site: Secondary | ICD-10-CM | POA: Diagnosis not present

## 2023-04-17 DIAGNOSIS — Z79899 Other long term (current) drug therapy: Secondary | ICD-10-CM | POA: Diagnosis not present

## 2023-04-17 DIAGNOSIS — M79643 Pain in unspecified hand: Secondary | ICD-10-CM | POA: Diagnosis not present

## 2023-04-17 DIAGNOSIS — M549 Dorsalgia, unspecified: Secondary | ICD-10-CM | POA: Diagnosis not present

## 2023-04-17 DIAGNOSIS — E1169 Type 2 diabetes mellitus with other specified complication: Secondary | ICD-10-CM | POA: Diagnosis not present

## 2023-04-17 DIAGNOSIS — E785 Hyperlipidemia, unspecified: Secondary | ICD-10-CM | POA: Diagnosis not present

## 2023-04-17 DIAGNOSIS — M0609 Rheumatoid arthritis without rheumatoid factor, multiple sites: Secondary | ICD-10-CM | POA: Diagnosis not present

## 2023-05-02 DIAGNOSIS — E890 Postprocedural hypothyroidism: Secondary | ICD-10-CM | POA: Diagnosis not present

## 2023-05-02 DIAGNOSIS — M62830 Muscle spasm of back: Secondary | ICD-10-CM | POA: Diagnosis not present

## 2023-05-02 DIAGNOSIS — M0600 Rheumatoid arthritis without rheumatoid factor, unspecified site: Secondary | ICD-10-CM | POA: Diagnosis not present

## 2023-05-02 DIAGNOSIS — R634 Abnormal weight loss: Secondary | ICD-10-CM | POA: Diagnosis not present

## 2023-05-02 DIAGNOSIS — M109 Gout, unspecified: Secondary | ICD-10-CM | POA: Diagnosis not present

## 2023-05-02 DIAGNOSIS — R0781 Pleurodynia: Secondary | ICD-10-CM | POA: Diagnosis not present

## 2023-05-02 DIAGNOSIS — E782 Mixed hyperlipidemia: Secondary | ICD-10-CM | POA: Diagnosis not present

## 2023-05-02 DIAGNOSIS — E1169 Type 2 diabetes mellitus with other specified complication: Secondary | ICD-10-CM | POA: Diagnosis not present

## 2023-05-02 DIAGNOSIS — I10 Essential (primary) hypertension: Secondary | ICD-10-CM | POA: Diagnosis not present

## 2023-07-17 DIAGNOSIS — R768 Other specified abnormal immunological findings in serum: Secondary | ICD-10-CM | POA: Diagnosis not present

## 2023-07-17 DIAGNOSIS — M25431 Effusion, right wrist: Secondary | ICD-10-CM | POA: Diagnosis not present

## 2023-07-17 DIAGNOSIS — M1990 Unspecified osteoarthritis, unspecified site: Secondary | ICD-10-CM | POA: Diagnosis not present

## 2023-07-17 DIAGNOSIS — M79643 Pain in unspecified hand: Secondary | ICD-10-CM | POA: Diagnosis not present

## 2023-07-17 DIAGNOSIS — E1169 Type 2 diabetes mellitus with other specified complication: Secondary | ICD-10-CM | POA: Diagnosis not present

## 2023-07-17 DIAGNOSIS — Z13820 Encounter for screening for osteoporosis: Secondary | ICD-10-CM | POA: Diagnosis not present

## 2023-07-17 DIAGNOSIS — M0609 Rheumatoid arthritis without rheumatoid factor, multiple sites: Secondary | ICD-10-CM | POA: Diagnosis not present

## 2023-07-17 DIAGNOSIS — M549 Dorsalgia, unspecified: Secondary | ICD-10-CM | POA: Diagnosis not present

## 2023-07-17 DIAGNOSIS — Z79899 Other long term (current) drug therapy: Secondary | ICD-10-CM | POA: Diagnosis not present

## 2023-07-17 DIAGNOSIS — E785 Hyperlipidemia, unspecified: Secondary | ICD-10-CM | POA: Diagnosis not present

## 2023-09-03 DIAGNOSIS — E1169 Type 2 diabetes mellitus with other specified complication: Secondary | ICD-10-CM | POA: Diagnosis not present

## 2023-09-03 DIAGNOSIS — Z79899 Other long term (current) drug therapy: Secondary | ICD-10-CM | POA: Diagnosis not present

## 2023-09-03 DIAGNOSIS — Z13820 Encounter for screening for osteoporosis: Secondary | ICD-10-CM | POA: Diagnosis not present

## 2023-09-03 DIAGNOSIS — R768 Other specified abnormal immunological findings in serum: Secondary | ICD-10-CM | POA: Diagnosis not present

## 2023-09-03 DIAGNOSIS — E785 Hyperlipidemia, unspecified: Secondary | ICD-10-CM | POA: Diagnosis not present

## 2023-09-03 DIAGNOSIS — M79643 Pain in unspecified hand: Secondary | ICD-10-CM | POA: Diagnosis not present

## 2023-09-03 DIAGNOSIS — M0609 Rheumatoid arthritis without rheumatoid factor, multiple sites: Secondary | ICD-10-CM | POA: Diagnosis not present

## 2023-09-03 DIAGNOSIS — M549 Dorsalgia, unspecified: Secondary | ICD-10-CM | POA: Diagnosis not present

## 2023-09-03 DIAGNOSIS — M1990 Unspecified osteoarthritis, unspecified site: Secondary | ICD-10-CM | POA: Diagnosis not present

## 2023-09-11 DIAGNOSIS — M109 Gout, unspecified: Secondary | ICD-10-CM | POA: Diagnosis not present

## 2023-09-11 DIAGNOSIS — E890 Postprocedural hypothyroidism: Secondary | ICD-10-CM | POA: Diagnosis not present

## 2023-09-11 DIAGNOSIS — M0600 Rheumatoid arthritis without rheumatoid factor, unspecified site: Secondary | ICD-10-CM | POA: Diagnosis not present

## 2023-09-11 DIAGNOSIS — M62830 Muscle spasm of back: Secondary | ICD-10-CM | POA: Diagnosis not present

## 2023-09-11 DIAGNOSIS — E782 Mixed hyperlipidemia: Secondary | ICD-10-CM | POA: Diagnosis not present

## 2023-09-11 DIAGNOSIS — Z23 Encounter for immunization: Secondary | ICD-10-CM | POA: Diagnosis not present

## 2023-09-11 DIAGNOSIS — E1169 Type 2 diabetes mellitus with other specified complication: Secondary | ICD-10-CM | POA: Diagnosis not present

## 2023-09-11 DIAGNOSIS — R634 Abnormal weight loss: Secondary | ICD-10-CM | POA: Diagnosis not present

## 2023-09-11 DIAGNOSIS — I10 Essential (primary) hypertension: Secondary | ICD-10-CM | POA: Diagnosis not present

## 2023-09-26 DIAGNOSIS — M62830 Muscle spasm of back: Secondary | ICD-10-CM | POA: Diagnosis not present

## 2023-09-26 DIAGNOSIS — M545 Low back pain, unspecified: Secondary | ICD-10-CM | POA: Diagnosis not present

## 2023-12-04 DIAGNOSIS — E785 Hyperlipidemia, unspecified: Secondary | ICD-10-CM | POA: Diagnosis not present

## 2023-12-04 DIAGNOSIS — M542 Cervicalgia: Secondary | ICD-10-CM | POA: Diagnosis not present

## 2023-12-04 DIAGNOSIS — E1169 Type 2 diabetes mellitus with other specified complication: Secondary | ICD-10-CM | POA: Diagnosis not present

## 2023-12-04 DIAGNOSIS — M1990 Unspecified osteoarthritis, unspecified site: Secondary | ICD-10-CM | POA: Diagnosis not present

## 2023-12-04 DIAGNOSIS — M549 Dorsalgia, unspecified: Secondary | ICD-10-CM | POA: Diagnosis not present

## 2023-12-04 DIAGNOSIS — M0609 Rheumatoid arthritis without rheumatoid factor, multiple sites: Secondary | ICD-10-CM | POA: Diagnosis not present

## 2023-12-04 DIAGNOSIS — R7689 Other specified abnormal immunological findings in serum: Secondary | ICD-10-CM | POA: Diagnosis not present

## 2023-12-04 DIAGNOSIS — Z79899 Other long term (current) drug therapy: Secondary | ICD-10-CM | POA: Diagnosis not present

## 2023-12-04 DIAGNOSIS — M79643 Pain in unspecified hand: Secondary | ICD-10-CM | POA: Diagnosis not present
